# Patient Record
Sex: Female | Born: 1980 | Race: Black or African American | Hispanic: No | Marital: Married | Smoking: Never smoker
Health system: Southern US, Community
[De-identification: ages and names within clinical notes are randomized; demographics above are authoritative.]

## PROBLEM LIST (undated history)

## (undated) DIAGNOSIS — K219 Gastro-esophageal reflux disease without esophagitis: Secondary | ICD-10-CM

## (undated) DIAGNOSIS — A048 Other specified bacterial intestinal infections: Secondary | ICD-10-CM

## (undated) DIAGNOSIS — Z789 Other specified health status: Secondary | ICD-10-CM

## (undated) DIAGNOSIS — Z9109 Other allergy status, other than to drugs and biological substances: Secondary | ICD-10-CM

## (undated) HISTORY — DX: Gastro-esophageal reflux disease without esophagitis: K21.9

## (undated) HISTORY — DX: Other specified bacterial intestinal infections: A04.8

---
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
## 2002-01-12 ENCOUNTER — Other Ambulatory Visit: Admission: RE | Admit: 2002-01-12 | Discharge: 2002-01-12 | Payer: Self-pay | Admitting: Family Medicine

## 2003-04-25 ENCOUNTER — Emergency Department (HOSPITAL_COMMUNITY): Admission: EM | Admit: 2003-04-25 | Discharge: 2003-04-25 | Payer: Self-pay | Admitting: Emergency Medicine

## 2009-01-29 ENCOUNTER — Encounter: Admission: RE | Admit: 2009-01-29 | Discharge: 2009-01-29 | Payer: Self-pay | Admitting: Family Medicine

## 2010-01-31 ENCOUNTER — Inpatient Hospital Stay (HOSPITAL_COMMUNITY): Admission: AD | Admit: 2010-01-31 | Payer: Self-pay | Source: Home / Self Care | Admitting: Obstetrics and Gynecology

## 2010-03-06 ENCOUNTER — Inpatient Hospital Stay (HOSPITAL_COMMUNITY): Payer: 59

## 2010-03-06 ENCOUNTER — Inpatient Hospital Stay (HOSPITAL_COMMUNITY)
Admission: AD | Admit: 2010-03-06 | Discharge: 2010-03-24 | DRG: 778 | Disposition: A | Discharge status: Home / Self Care | Payer: 59 | Source: Ambulatory Visit | Attending: Obstetrics & Gynecology | Admitting: Obstetrics & Gynecology

## 2010-03-06 ENCOUNTER — Encounter (HOSPITAL_COMMUNITY): Payer: 59

## 2010-03-06 ENCOUNTER — Encounter (HOSPITAL_COMMUNITY): Payer: Self-pay | Admitting: Radiology

## 2010-03-06 DIAGNOSIS — O409XX0 Polyhydramnios, unspecified trimester, not applicable or unspecified: Secondary | ICD-10-CM

## 2010-03-06 DIAGNOSIS — O358XX0 Maternal care for other (suspected) fetal abnormality and damage, not applicable or unspecified: Secondary | ICD-10-CM | POA: Diagnosis present

## 2010-03-06 DIAGNOSIS — Q893 Situs inversus: Secondary | ICD-10-CM

## 2010-03-06 DIAGNOSIS — O321XX0 Maternal care for breech presentation, not applicable or unspecified: Secondary | ICD-10-CM | POA: Diagnosis present

## 2010-03-06 DIAGNOSIS — R6252 Short stature (child): Secondary | ICD-10-CM

## 2010-03-06 DIAGNOSIS — O4700 False labor before 37 completed weeks of gestation, unspecified trimester: Principal | ICD-10-CM

## 2010-03-07 ENCOUNTER — Inpatient Hospital Stay (HOSPITAL_COMMUNITY): Payer: 59

## 2010-03-08 ENCOUNTER — Inpatient Hospital Stay (HOSPITAL_COMMUNITY): Payer: 59

## 2010-03-09 ENCOUNTER — Inpatient Hospital Stay (HOSPITAL_COMMUNITY): Payer: 59

## 2010-03-10 ENCOUNTER — Inpatient Hospital Stay (HOSPITAL_COMMUNITY)
Admit: 2010-03-10 | Discharge: 2010-03-10 | Disposition: A | Discharge status: Home / Self Care | Payer: 59 | Attending: Obstetrics & Gynecology | Admitting: Obstetrics & Gynecology

## 2010-03-10 ENCOUNTER — Other Ambulatory Visit (HOSPITAL_COMMUNITY): Payer: 59

## 2010-03-10 ENCOUNTER — Inpatient Hospital Stay (HOSPITAL_COMMUNITY): Payer: 59

## 2010-03-11 ENCOUNTER — Inpatient Hospital Stay (HOSPITAL_COMMUNITY): Payer: 59

## 2010-03-12 ENCOUNTER — Inpatient Hospital Stay (HOSPITAL_COMMUNITY): Payer: 59

## 2010-03-13 ENCOUNTER — Inpatient Hospital Stay (HOSPITAL_COMMUNITY): Payer: 59

## 2010-03-14 ENCOUNTER — Inpatient Hospital Stay (HOSPITAL_COMMUNITY): Payer: 59

## 2010-03-15 ENCOUNTER — Inpatient Hospital Stay (HOSPITAL_COMMUNITY): Payer: 59

## 2010-03-16 ENCOUNTER — Other Ambulatory Visit (HOSPITAL_COMMUNITY): Payer: 59

## 2010-03-17 ENCOUNTER — Inpatient Hospital Stay (HOSPITAL_COMMUNITY): Payer: 59

## 2010-03-18 ENCOUNTER — Inpatient Hospital Stay (HOSPITAL_COMMUNITY): Payer: 59

## 2010-03-18 ENCOUNTER — Other Ambulatory Visit (HOSPITAL_COMMUNITY): Payer: 59

## 2010-03-19 ENCOUNTER — Other Ambulatory Visit (HOSPITAL_COMMUNITY): Payer: 59

## 2010-03-20 ENCOUNTER — Inpatient Hospital Stay (HOSPITAL_COMMUNITY): Payer: 59

## 2010-03-22 ENCOUNTER — Inpatient Hospital Stay (HOSPITAL_COMMUNITY): Payer: 59

## 2010-03-24 ENCOUNTER — Inpatient Hospital Stay (HOSPITAL_COMMUNITY): Payer: 59

## 2010-03-24 ENCOUNTER — Inpatient Hospital Stay (HOSPITAL_COMMUNITY): DRG: 778 | Payer: 59 | Attending: Obstetrics & Gynecology

## 2010-03-29 ENCOUNTER — Inpatient Hospital Stay (HOSPITAL_COMMUNITY)
Admission: AD | Admit: 2010-03-29 | Discharge: 2010-03-31 | DRG: 765 | Disposition: A | Discharge status: Home / Self Care | Payer: 59 | Source: Ambulatory Visit | Attending: Obstetrics and Gynecology | Admitting: Obstetrics and Gynecology

## 2010-03-29 ENCOUNTER — Other Ambulatory Visit: Payer: Self-pay | Admitting: Obstetrics & Gynecology

## 2010-03-29 ENCOUNTER — Inpatient Hospital Stay (HOSPITAL_COMMUNITY): Payer: 59

## 2010-03-29 DIAGNOSIS — O9903 Anemia complicating the puerperium: Secondary | ICD-10-CM | POA: Diagnosis not present

## 2010-03-29 DIAGNOSIS — D62 Acute posthemorrhagic anemia: Secondary | ICD-10-CM | POA: Diagnosis not present

## 2010-03-29 DIAGNOSIS — O4290 Premature rupture of membranes, unspecified as to length of time between rupture and onset of labor, unspecified weeks of gestation: Secondary | ICD-10-CM | POA: Diagnosis present

## 2010-03-29 DIAGNOSIS — O328XX0 Maternal care for other malpresentation of fetus, not applicable or unspecified: Principal | ICD-10-CM | POA: Diagnosis present

## 2010-03-29 LAB — CBC
MCH: 27.6 pg (ref 26.0–34.0)
MCHC: 34 g/dL (ref 30.0–36.0)
Platelets: 229 10*3/uL (ref 150–400)
RDW: 12.2 % (ref 11.5–15.5)

## 2010-03-29 LAB — RPR: RPR Ser Ql: NONREACTIVE

## 2010-03-30 LAB — CBC
MCHC: 32.8 g/dL (ref 30.0–36.0)
Platelets: 168 10*3/uL (ref 150–400)
RDW: 12.2 % (ref 11.5–15.5)

## 2010-04-02 NOTE — Op Note (Signed)
NAMEMEYER, DOCKERY               ACCOUNT NO.:  [id number]  MEDICAL RECORD NO.:  [id number]           PATIENT TYPE:  I  LOCATION:  9316                          [facility]ILITY:  [facility]  PHYSICIAN:  Darryl Nestle, MD     DATE OF BIRTH:  20-Apr-1980  DATE OF PROCEDURE:  03/29/2010 DATE OF DISCHARGE:                              OPERATIVE REPORT   PREOPERATIVE DIAGNOSES:  A 31 weeks and 3 days premature rupture of membranes, preterm labor, footling breech.  POSTOPERATIVE DIAGNOSES:  A 31 weeks and 3 days premature rupture of membranes, preterm labor, footling breech.  PROCEDURE:  Primary classical cesarean section.  SURGEON:  Darryl Nestle, MD  ASSISTANT:  Marlinda Mike, CNM  ANESTHESIA:  Spinal.  FINDINGS:  Baby in footling breech, complete breech extraction delivery, female infant, born at 2:44 a.m., weight 2 pounds and 13 ounces, Apgars 9 and 9, three-vessel cord, normal placenta.  SPECIMEN:  Placenta (cord blood sent for gas, but clotted).  Disposition of placenta to pathology.  ESTIMATED BLOOD LOSS:  600 mL.  IV FLUIDS:  1700 LR.  URINE OUTPUT:  600 mL, clear.  COMPLICATIONS:  None.  CONDITION:  Stable.  PATIENT DISPOSITION:  PACU.  PROCEDURE:  The patient is a 30 year old G1, P0 at 31 weeks and 3 days who had premature rupture of membranes, preterm labor.  She was 3 cm dilated with baby's foot presenting at cervical external os without any  cord prolapse. Of note, the patient had polyhydramnios and preterm cervical dilatation.  She was admitted for 2 weeks.  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  The patient had received betamethasone and was on Procardia 30 mg XL every 12 hours.  She had rupture of membranes at about 12 midnight and presented to MAU.  She was noted to have strong uterine contractions with cervical dilatation to 3 cm and 1 foot of the fetus at external os of cervix in the vagina.   Hence, the decision was made to proceed with cesarean delivery.  Neonatology Service was notified.  The patient received magnesium sulfate while getting prepared for surgery for fetal neuroprophylaxis.  She received 1 g Ancef preop.  She was brought to the operating room with IV running after informed written consent was obtained, after discussing risks and complications of surgery including infection, bleeding and damage to internal organs and delayed complications.  The patient received spinal anesthesia without complications, which was noted to be adequate.  Foley catheter was placed.  Parts were prepped and draped in normal standard fashion.  A Pfannenstiel incision was made with scalpel, carried down to the underlying fascia, which was incised in the midline extended laterally curving up.  Fascia was separated from the muscles by blunt and sharp dissection.  Muscles were separated in the midline.  Peritoneum was grasped with pickups and entered sharply with scissors and extended superiorly and inferiorly.  Bowel loops were packed with moist sponge, which was tagged.  The lower segment was narrow and hence decision was made to proceed with classical cesarean section.  Bladder flap was created  by incising the UV fold of peritoneum and a vertical incision was made on the uterus, which was carried down to the underlying amniotic membrane.  The incision was extended superiorly and inferiorly to have adequate exposure.  Baby's back was anterior.  The buttocks were reached gently.  The baby was lifted out of the incision with the buttocks, the legs followed with gentle flexion at the knee.  Both the arms were delivered by gentle rotation over the chest and the head was delivered by a gentle flexion of the head with the finger in the mouth as well as fundal pressure.  Cord was clamped and cut.  Baby was given to the awaiting neonatology team.  Cord gas collection was attempted.  Small  amount collected, which was later on noted to be clotted.   Placenta was removed manually. Interior of the uterus was cleaned with clean lap x2.  Placenta was sent to pathology, appeared to be complete in a three vessels.  The uterine hysterotomy edges were grasped with Allis and hysterotomy was closed in three layers; first layer was continuous interlocking, second one was imbricating and third was a baseball stitch of Vicryl.  Hemostasis was excellent.  Peritoneal irrigation was performed.  Bilateral ovaries and tubes appeared normal.  Parietal peritoneum was closed using 2-0 Vicryl. Fascia was closed using 0 Vicryl starting at two angles meeting in midline.  Subcutaneous tissue was inspected.  Hemostasis was achieved. Skin was approximated using 3-0 Vicryl in subcuticular fashion.  Steri- Strips applied, sterile dressing given and the patient was brought to the recovery room. All counts were correct x2.   Baby was brought to the NICU in stable condition.     Darryl Nestle, MD     VM/MEDQ  D:  03/29/2010  T:  03/29/2010  Job:  [id number]  Electronically Signed by Susa Day Mouhamed Glassco  on 04/02/2010 04:58:38 PM

## 2010-04-02 NOTE — Discharge Summary (Signed)
Heidi Collins, Heidi Collins               ACCOUNT NO.:  [id number]  MEDICAL RECORD NO.:  [id number]           PATIENT TYPE:  I  LOCATION:  9152                          [facility]  PHYSICIAN:  Darryl Nestle, MD     DATE OF BIRTH:  12/09/1980  DATE OF ADMISSION:  03/06/2010 DATE OF DISCHARGE:  03/24/2010                              DISCHARGE SUMMARY   ADMISSION DIAGNOSES: 1. G1, P0, 28 weeks and 1 day, preterm labor. 2. Polyhydramnios. 3. Fetal stomach anomaly.  DISCHARGE DIAGNOSES: 1. G1, P0, 28 weeks and 1 day, preterm labor. 2. Polyhydramnios. 3. Fetal stomach anomaly. 4. 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  ADMISSION AND DISCHARGE PHYSICIAN:  Darryl Nestle, MD  CONSULTANTS:  MFM, Dr. Sherrie George, and Dr. Tona Sensing; and pediatric cardiologist, Dr. Elizebeth Brooking for fetal echo, and neonatologist, Dr. Eric Form.  DISCHARGE CONDITION:  Stable.  DISCHARGE DISPOSITION:  Home with family.  Follow up in office, with MFM and with Pediatric surgeon at [facility] hosp.  HOSPITAL COURSE:  The patient is 30 year old G1, P0 who had uncomplicated pregnancy until 28 weeks.  Patient's prenatal course began at 8 weeks at [facility].  She had normal prenatal labs.  She had first semester screen with ultra screen and nuchal testing which were normal and she had a 16-week AFP which was normal.  The patient had a normal anatomy ultrasound at [facility] at 18 weeks which was normal.  At 28 weeks, the patient was noted to have significant weight gain, lower extremity edema, and her size of the fundus of the uterus was much larger for gestational age.  She had an obstetric ultrasound on March 06, 2010, that noted significant polyhydramnios approximately 33-36 cm. Appropriate for gestational age fetal growth but a double bubble on the stomach suggesting duodenal atresia.  The patient was noted to be 1-cm dilated with bulging lower segment.   She was immediately admitted to the hospital.  She was on bedrest with continuous monitoring of her contractions and vital signs were monitored per protocol.  She had intermittent fetal heart tracing because of fetal activity, but it was reassuring.  She received two doses of betamethasone on March 06, 2010, and March 07, 2010, for fetal lung maturity.  She was begun on Indocin 25 mg q.6 hours for 72 hours.  She had Neonatology consult with Dr. Eric Form and MFM consult.  The patient had initial ultrasound with Radiology, but was reviewed by Dr. Rica Koyanagi.  She had a followup study with Dr. Sherrie George.  Findings are in document and then 2 weeks after, on March 24, 2010, she had a followup ultrasound for growth as well as evaluation of anomaly with Dr. Tona Sensing.  During her hospital stay, she also had a consult for fetal echogram with Dr. Elizebeth Brooking in-house.  The fetal echogram was normal.  Her growth ultrasound with, Dr. Tona Sensing, showed appropriate growth in the 2 weeks.  The ultrasound noted same stomach anomaly with heterotaxy or partial situs inversus with liver and stomach in the opposite side and also a possible duodenal atresia  versus some obstruction causing dilated stomach as well as dilated bowel loops. The fetal heart tracing was intermittently reactive and so she received biophysical profile initially daily and then we did alternate days if biophysical profile was 8/8.  On March 23, 2010, intermittent variable decelerations were noted on fetal heart tracing and therefore the patient was placed on continuous monitoring without any further decelerations for 12 hours prior to discharge.  The biophysical profile was 8/8 on the date of her growth ultrasound on March 24, 2010.  Her cervical exam was repeated on March 24, 2010, which was unchanged at 1-cm with distended lower segment.  Please note, the baby was in breech position on all the ultrasound that were done at the  hospital.   The patient was on nifedipine 30 mg XL every 12 hours after Indocin was stopped which was continued as a discharge medication. She had not changed her cervical dilatation any further and fetal status was reassuring with adequate growth.  The patient was feeling adequate fetal kick counts, this is a very compliant patient who has an adult family member at home to assist and she prefers to be discharged home after extensive counseling.  Decision was made for discharge.  The patient will follow up in office two times a week for antenatal testing with NST and biophysical profile.  She will have every 2 weeks ultrasound with MFM for growth and evaluation of the anomaly.  The patient will have outpatient consult with pediatric surgeon at [facility] for preparation for surgery at birth.  Preterm labor warning signs as well as fetal activity evaluation throughout the day, warning signs of bleeding and loss of fluid, etc, were reviewed.  The patient verbalized understanding and aware that she may need readmission if any findings change.  The patient and her husband voiced understanding and will follow up as recommended in office and for all consults.  The patient was given the option to be transferred to [facility] for MFM care versus delivery at [facility] and then transfer of baby to [facility] for operative care.  Neonatology team, including Dr. Eric Form are comfortable having the delivery of the baby at [facility] and then transporting the baby to [facility] and patient prefers to be delivered in [facility] since she is from [place] and her husband works in [place], and they prefer to be delivered here in [facility]. We will accommodate her requisition when it is possible, but she is aware that if anything changes for her as well as the baby, the plan may change for delivery at [facility].  The patient was discharged home in stable  condition.     Darryl Nestle, MD     VM/MEDQ  D:  03/26/2010  T:  03/27/2010  Job:  [id number]  Electronically Signed by Susa Day Charlei Ramsaran  on 04/02/2010 04:54:43 PM

## 2010-04-07 ENCOUNTER — Ambulatory Visit (HOSPITAL_COMMUNITY): Payer: 59

## 2010-05-07 ENCOUNTER — Other Ambulatory Visit: Payer: Self-pay | Admitting: Obstetrics & Gynecology

## 2010-06-02 NOTE — Discharge Summary (Signed)
  Heidi Collins               ACCOUNT NO.:  [id number]  MEDICAL RECORD NO.:  [id number]           PATIENT TYPE:  I  LOCATION:  9316                          [facility]  [facility]  PHYSICIAN:  Darryl Nestle, MD     DATE OF BIRTH:  05-27-1980  DATE OF ADMISSION:  03/29/2010 DATE OF DISCHARGE:  03/31/2010                              DISCHARGE SUMMARY   ADMISSION DIAGNOSES:  A 31 weeks' gestation, spontaneous rupture of membranes, preterm labor, breech presentation.  DISCHARGE DIAGNOSES:  Postoperative day #3, status post cesarean section.  PATIENT HISTORY:  The patient is a gravida 1, para 0 at 66 weeks' gestation with an [facility] of May 28, 2010.  Prenatal care has been at [facility] with Dr. Juliene Pina as primary since 10 weeks' gestation. Pregnancy is complicated by history of preterm labor, preterm cervical change.  PRENATAL LABS:  The patient is A positive.  Antibody screen negative. Rubella positive.  HIV negative.  RPR negative.  Hepatitis B negative. GBS positive.  MEDICATIONS:  Prenatal vitamin 1 tablet p.o. daily.  ALLERGIES:  No known drug allergies.  MEDICAL AND SURGICAL HISTORY:  Noncontributory.  PRESENTATION:  The patient presents with onset of labor and spontaneous rupture of membranes, confirm breech presentation.  Decision to proceed with immediate cesarean section.  Preoperative CBC; white blood cell count 17.1, hemoglobin 14.4, hematocrit 42.9, and platelet count of 229.  PROCEDURE:  The patient undergoes cesarean section primary by Dr. Juliene Pina, delivery of a female 2 pounds 3 ounces.  Newborn is transferred to NICU.  POSTOPERATIVE COURSE:  The patient's postoperative course is uneventful. Postoperative CBC, white blood cell count 14.9, hemoglobin 9.8, hematocrit 29.9, and a platelet count of 168.  The patient remains afebrile during hospitalization.  Vital signs are stable.  Physical exam was within normal limits.  Wound edges are well approximated  with subcuticular closure.  No erythema, no ecchymosis, and no drainage.  DISCHARGE INSTRUCTIONS:  The patient discharged home in stable condition.  ACTIVITY:  Postoperative restrictions x2 weeks.  DIET:  Regular.  MEDICATIONS AT THE TIME OF DISCHARGE: 1. Prenatal vitamin 1 tablet p.o. daily. 2. Motrin 800 mg p.o. q.8 hours p.r.n. discomfort. 3. Percocet 1-2 tablets p.o. q.4 hours p.r.n. for pain.     Marlinda Mike, C.N.M.   ______________________________ Darryl Nestle, MD    TB/MEDQ  D:  04/22/2010  T:  04/23/2010  Job:  [id number]  Electronically Signed by Marlinda Mike C.N.M. on 04/23/2010 04:24:00 PM Electronically Signed by Susa Day Lakely Elmendorf  on 06/02/2010 03:17:39 PM

## 2010-11-17 ENCOUNTER — Encounter (HOSPITAL_COMMUNITY): Payer: Self-pay | Admitting: *Deleted

## 2011-02-25 ENCOUNTER — Emergency Department (HOSPITAL_COMMUNITY)
Admission: EM | Admit: 2011-02-25 | Discharge: 2011-02-25 | Disposition: A | Discharge status: Home / Self Care | Payer: 59 | Attending: Emergency Medicine | Admitting: Emergency Medicine

## 2011-02-25 ENCOUNTER — Encounter (HOSPITAL_COMMUNITY): Payer: Self-pay | Admitting: Emergency Medicine

## 2011-02-25 DIAGNOSIS — H9209 Otalgia, unspecified ear: Secondary | ICD-10-CM | POA: Insufficient documentation

## 2011-02-25 DIAGNOSIS — J329 Chronic sinusitis, unspecified: Secondary | ICD-10-CM

## 2011-02-25 DIAGNOSIS — R220 Localized swelling, mass and lump, head: Secondary | ICD-10-CM | POA: Insufficient documentation

## 2011-02-25 DIAGNOSIS — J3489 Other specified disorders of nose and nasal sinuses: Secondary | ICD-10-CM | POA: Insufficient documentation

## 2011-02-25 DIAGNOSIS — H11419 Vascular abnormalities of conjunctiva, unspecified eye: Secondary | ICD-10-CM | POA: Insufficient documentation

## 2011-02-25 DIAGNOSIS — H6690 Otitis media, unspecified, unspecified ear: Secondary | ICD-10-CM

## 2011-02-25 MED ORDER — AMOXICILLIN 500 MG PO CAPS
ORAL_CAPSULE | ORAL | Status: AC
  Filled 2011-02-25: qty 1

## 2011-02-25 MED ORDER — AMOXICILLIN 500 MG PO CAPS
500.0000 mg | ORAL_CAPSULE | Freq: Once | ORAL | Status: AC
  Administered 2011-02-25: 23:00:00 via ORAL

## 2011-02-25 MED ORDER — AMOXICILLIN 500 MG PO CAPS: 500.0000 mg | ORAL_CAPSULE | Freq: Three times a day (TID) | ORAL | Status: AC

## 2011-02-25 NOTE — ED Provider Notes (Signed)
History     CSN: [id number]  Arrival date & time 02/25/11  2143   First MD Initiated Contact with Patient 02/25/11 2153      Chief Complaint  Patient presents with  . Otalgia    (Consider location/radiation/quality/duration/timing/severity/associated sxs/prior treatment) Patient is a 31 y.o. female presenting with URI. The history is provided by the patient.  URI The primary symptoms include ear pain. Primary symptoms do not include fever, sore throat, cough or rash. The current episode started 2 days ago. This is a new problem. The problem has been gradually worsening.  The onset of the illness is associated with exposure to sick contacts. Symptoms associated with the illness include sinus pressure, congestion and rhinorrhea. The following treatments were addressed: Acetaminophen was ineffective. NSAIDs were ineffective.    History reviewed. No pertinent past medical history.  Past Surgical History  Procedure Date  . Cesarean section     No family history on file.  History  Substance Use Topics  . Smoking status: Never Smoker   . Smokeless tobacco: Not on file  . Alcohol Use: No    OB History    Grav Para Term Preterm Abortions TAB SAB Ect Mult Living   2 1  1             Review of Systems  Constitutional: Negative for fever.  HENT: Positive for ear pain, congestion, rhinorrhea and sinus pressure. Negative for sore throat.   Respiratory: Negative for cough.   Skin: Negative for rash.  All other systems reviewed and are negative.    Allergies  Review of patient's allergies indicates no known allergies.  Home Medications   Current Outpatient Rx  Name Route Sig Dispense Refill  . ROBITUSSIN PEAK COLD DM PO Oral Take 10 mLs by mouth every 4 (four) hours as needed. For cough and cold    . IBUPROFEN 200 MG PO TABS Oral Take 100 mg by mouth every 6 (six) hours as needed. For fever    . NORETHINDRONE 0.35 MG PO TABS Oral Take 1 tablet by mouth daily.      BP  128/86  Pulse 65  Temp(Src) 97.7 F (36.5 C) (Oral)  Resp 18  SpO2 98%  Breastfeeding? Yes  Physical Exam  Nursing note and vitals reviewed. Constitutional: She is oriented to person, place, and time. She appears well-developed and well-nourished. No distress.  HENT:  Head: Normocephalic and atraumatic.  Right Ear: Tympanic membrane and ear canal normal.  Left Ear: Tympanic membrane is injected, erythematous and bulging. Tympanic membrane mobility is abnormal.  Nose: Mucosal edema and rhinorrhea present.  Mouth/Throat: Oropharynx is clear and moist.  Eyes: EOM are normal. Pupils are equal, round, and reactive to light.  Neck: Normal range of motion. Neck supple.  Musculoskeletal: Normal range of motion. She exhibits no tenderness.       No edema  Neurological: She is alert and oriented to person, place, and time. No cranial nerve deficit.  Skin: Skin is warm and dry. No rash noted.  Psychiatric: She has a normal mood and affect. Her behavior is normal.    ED Course  Procedures (including critical care time)  Labs Reviewed - No data to display No results found.   1. Otitis media   2. Sinusitis       MDM   Pt with sinusitis and ear pain starting today with left otitis media.  Will treat with nasal sprays and abx.        [facility],  MD 02/25/11 2234

## 2011-02-25 NOTE — ED Notes (Signed)
PT. REPORTS BILATERAL EAR ACHE WITH NASAL CONGESTION / RUNNY NOSE FOR 2 DAYS .

## 2011-05-04 ENCOUNTER — Ambulatory Visit: Payer: Self-pay | Admitting: Family Medicine

## 2011-05-04 ENCOUNTER — Other Ambulatory Visit: Payer: Self-pay | Admitting: Internal Medicine

## 2011-05-05 LAB — MEASLES/MUMPS/RUBELLA IMMUNITY
Mumps IgG: 1.69 {ISR} — ABNORMAL HIGH
Rubella: 110.5 IU/mL — ABNORMAL HIGH

## 2013-10-18 LAB — OB RESULTS CONSOLE HEPATITIS B SURFACE ANTIGEN: Hepatitis B Surface Ag: NEGATIVE

## 2013-10-18 LAB — OB RESULTS CONSOLE ANTIBODY SCREEN: Antibody Screen: NEGATIVE

## 2013-10-18 LAB — OB RESULTS CONSOLE ABO/RH: RH Type: POSITIVE

## 2013-10-18 LAB — OB RESULTS CONSOLE HIV ANTIBODY (ROUTINE TESTING): HIV: NONREACTIVE

## 2013-10-18 LAB — OB RESULTS CONSOLE RPR: RPR: NONREACTIVE

## 2013-10-18 LAB — OB RESULTS CONSOLE RUBELLA ANTIBODY, IGM: RUBELLA: IMMUNE

## 2013-12-03 ENCOUNTER — Encounter (HOSPITAL_COMMUNITY): Payer: Self-pay | Admitting: Emergency Medicine

## 2013-12-18 ENCOUNTER — Other Ambulatory Visit (HOSPITAL_COMMUNITY): Payer: Self-pay | Admitting: Obstetrics & Gynecology

## 2013-12-18 DIAGNOSIS — O289 Unspecified abnormal findings on antenatal screening of mother: Secondary | ICD-10-CM

## 2013-12-21 ENCOUNTER — Encounter (HOSPITAL_COMMUNITY): Payer: Self-pay

## 2013-12-21 ENCOUNTER — Ambulatory Visit (HOSPITAL_COMMUNITY)
Admission: RE | Admit: 2013-12-21 | Discharge: 2013-12-21 | Disposition: A | Discharge status: Home / Self Care | Payer: 59 | Source: Ambulatory Visit | Attending: Obstetrics & Gynecology | Admitting: Obstetrics & Gynecology

## 2013-12-21 ENCOUNTER — Ambulatory Visit (HOSPITAL_COMMUNITY): Admission: RE | Admit: 2013-12-21 | Payer: 59 | Source: Ambulatory Visit

## 2013-12-21 DIAGNOSIS — O3421 Maternal care for scar from previous cesarean delivery: Secondary | ICD-10-CM | POA: Insufficient documentation

## 2013-12-21 DIAGNOSIS — O350XX0 Maternal care for (suspected) central nervous system malformation in fetus, not applicable or unspecified: Secondary | ICD-10-CM | POA: Diagnosis present

## 2013-12-21 DIAGNOSIS — Z3A18 18 weeks gestation of pregnancy: Secondary | ICD-10-CM | POA: Diagnosis not present

## 2013-12-21 DIAGNOSIS — O09212 Supervision of pregnancy with history of pre-term labor, second trimester: Secondary | ICD-10-CM | POA: Insufficient documentation

## 2013-12-21 DIAGNOSIS — O289 Unspecified abnormal findings on antenatal screening of mother: Secondary | ICD-10-CM | POA: Insufficient documentation

## 2013-12-21 HISTORY — DX: Other specified health status: Z78.9

## 2013-12-25 ENCOUNTER — Other Ambulatory Visit (HOSPITAL_COMMUNITY): Payer: Self-pay

## 2014-01-08 ENCOUNTER — Other Ambulatory Visit (HOSPITAL_COMMUNITY): Payer: Self-pay | Admitting: Obstetrics & Gynecology

## 2014-01-08 ENCOUNTER — Encounter (HOSPITAL_COMMUNITY): Payer: Self-pay

## 2014-01-08 ENCOUNTER — Ambulatory Visit (HOSPITAL_COMMUNITY)
Admission: RE | Admit: 2014-01-08 | Discharge: 2014-01-08 | Disposition: A | Discharge status: Home / Self Care | Payer: 59 | Source: Ambulatory Visit | Attending: Obstetrics & Gynecology | Admitting: Obstetrics & Gynecology

## 2014-01-08 VITALS — BP 137/65 | HR 72 | Wt 148.0 lb

## 2014-01-08 DIAGNOSIS — O350XX0 Maternal care for (suspected) central nervous system malformation in fetus, not applicable or unspecified: Secondary | ICD-10-CM | POA: Diagnosis present

## 2014-01-08 DIAGNOSIS — O3509X0 Maternal care for (suspected) other central nervous system malformation or damage in fetus, not applicable or unspecified: Secondary | ICD-10-CM | POA: Insufficient documentation

## 2014-01-08 DIAGNOSIS — O4700 False labor before 37 completed weeks of gestation, unspecified trimester: Secondary | ICD-10-CM

## 2014-01-08 DIAGNOSIS — Q893 Situs inversus: Secondary | ICD-10-CM

## 2014-01-08 DIAGNOSIS — O09292 Supervision of pregnancy with other poor reproductive or obstetric history, second trimester: Secondary | ICD-10-CM | POA: Insufficient documentation

## 2014-01-08 DIAGNOSIS — Z3A20 20 weeks gestation of pregnancy: Secondary | ICD-10-CM | POA: Diagnosis not present

## 2014-01-10 ENCOUNTER — Other Ambulatory Visit (HOSPITAL_COMMUNITY): Payer: Self-pay | Admitting: Obstetrics & Gynecology

## 2014-01-10 DIAGNOSIS — O3509X0 Maternal care for (suspected) other central nervous system malformation or damage in fetus, not applicable or unspecified: Secondary | ICD-10-CM

## 2014-01-10 DIAGNOSIS — O350XX0 Maternal care for (suspected) central nervous system malformation in fetus, not applicable or unspecified: Secondary | ICD-10-CM

## 2014-02-05 ENCOUNTER — Encounter (HOSPITAL_COMMUNITY): Payer: Self-pay

## 2014-02-05 ENCOUNTER — Ambulatory Visit (HOSPITAL_COMMUNITY)
Admission: RE | Admit: 2014-02-05 | Discharge: 2014-02-05 | Disposition: A | Discharge status: Home / Self Care | Payer: 59 | Source: Ambulatory Visit | Attending: Obstetrics & Gynecology | Admitting: Obstetrics & Gynecology

## 2014-02-05 DIAGNOSIS — O3509X0 Maternal care for (suspected) other central nervous system malformation or damage in fetus, not applicable or unspecified: Secondary | ICD-10-CM

## 2014-02-05 DIAGNOSIS — Z3A24 24 weeks gestation of pregnancy: Secondary | ICD-10-CM | POA: Diagnosis not present

## 2014-02-05 DIAGNOSIS — O350XX0 Maternal care for (suspected) central nervous system malformation in fetus, not applicable or unspecified: Secondary | ICD-10-CM

## 2014-02-05 DIAGNOSIS — O358XX0 Maternal care for other (suspected) fetal abnormality and damage, not applicable or unspecified: Secondary | ICD-10-CM | POA: Diagnosis present

## 2014-02-05 DIAGNOSIS — O3421 Maternal care for scar from previous cesarean delivery: Secondary | ICD-10-CM | POA: Insufficient documentation

## 2014-02-05 DIAGNOSIS — O09212 Supervision of pregnancy with history of pre-term labor, second trimester: Secondary | ICD-10-CM | POA: Insufficient documentation

## 2014-02-06 ENCOUNTER — Other Ambulatory Visit (HOSPITAL_COMMUNITY): Payer: Self-pay | Admitting: Obstetrics & Gynecology

## 2014-02-06 DIAGNOSIS — O352XX1 Maternal care for (suspected) hereditary disease in fetus, fetus 1: Secondary | ICD-10-CM

## 2014-02-06 DIAGNOSIS — O34219 Maternal care for unspecified type scar from previous cesarean delivery: Secondary | ICD-10-CM

## 2014-02-06 DIAGNOSIS — Z8751 Personal history of pre-term labor: Secondary | ICD-10-CM

## 2014-02-06 DIAGNOSIS — O359XX0 Maternal care for (suspected) fetal abnormality and damage, unspecified, not applicable or unspecified: Secondary | ICD-10-CM

## 2014-02-06 DIAGNOSIS — Z3A28 28 weeks gestation of pregnancy: Secondary | ICD-10-CM

## 2014-03-05 ENCOUNTER — Other Ambulatory Visit (HOSPITAL_COMMUNITY): Payer: Self-pay | Admitting: Obstetrics & Gynecology

## 2014-03-05 ENCOUNTER — Encounter (HOSPITAL_COMMUNITY): Payer: Self-pay

## 2014-03-05 ENCOUNTER — Ambulatory Visit (HOSPITAL_COMMUNITY)
Admission: RE | Admit: 2014-03-05 | Discharge: 2014-03-05 | Disposition: A | Discharge status: Home / Self Care | Payer: 59 | Source: Ambulatory Visit | Attending: Obstetrics & Gynecology | Admitting: Obstetrics & Gynecology

## 2014-03-05 DIAGNOSIS — O352XX1 Maternal care for (suspected) hereditary disease in fetus, fetus 1: Secondary | ICD-10-CM

## 2014-03-05 DIAGNOSIS — O09293 Supervision of pregnancy with other poor reproductive or obstetric history, third trimester: Secondary | ICD-10-CM | POA: Insufficient documentation

## 2014-03-05 DIAGNOSIS — O359XX0 Maternal care for (suspected) fetal abnormality and damage, unspecified, not applicable or unspecified: Secondary | ICD-10-CM

## 2014-03-05 DIAGNOSIS — Z8751 Personal history of pre-term labor: Secondary | ICD-10-CM

## 2014-03-05 DIAGNOSIS — O358XX0 Maternal care for other (suspected) fetal abnormality and damage, not applicable or unspecified: Secondary | ICD-10-CM | POA: Diagnosis present

## 2014-03-05 DIAGNOSIS — O3421 Maternal care for scar from previous cesarean delivery: Secondary | ICD-10-CM | POA: Diagnosis not present

## 2014-03-05 DIAGNOSIS — Z3A28 28 weeks gestation of pregnancy: Secondary | ICD-10-CM | POA: Insufficient documentation

## 2014-03-05 DIAGNOSIS — O34219 Maternal care for unspecified type scar from previous cesarean delivery: Secondary | ICD-10-CM

## 2014-03-18 ENCOUNTER — Other Ambulatory Visit: Payer: Self-pay | Admitting: Obstetrics & Gynecology

## 2014-04-09 ENCOUNTER — Encounter (HOSPITAL_COMMUNITY): Payer: Self-pay

## 2014-04-09 ENCOUNTER — Ambulatory Visit (HOSPITAL_COMMUNITY)
Admission: RE | Admit: 2014-04-09 | Discharge: 2014-04-09 | Disposition: A | Discharge status: Home / Self Care | Payer: 59 | Source: Ambulatory Visit | Attending: Internal Medicine | Admitting: Internal Medicine

## 2014-04-09 DIAGNOSIS — O09293 Supervision of pregnancy with other poor reproductive or obstetric history, third trimester: Secondary | ICD-10-CM | POA: Insufficient documentation

## 2014-04-09 DIAGNOSIS — O352XX0 Maternal care for (suspected) hereditary disease in fetus, not applicable or unspecified: Secondary | ICD-10-CM | POA: Insufficient documentation

## 2014-04-09 DIAGNOSIS — O359XX0 Maternal care for (suspected) fetal abnormality and damage, unspecified, not applicable or unspecified: Secondary | ICD-10-CM | POA: Insufficient documentation

## 2014-04-09 DIAGNOSIS — Z3A28 28 weeks gestation of pregnancy: Secondary | ICD-10-CM

## 2014-04-09 DIAGNOSIS — O34219 Maternal care for unspecified type scar from previous cesarean delivery: Secondary | ICD-10-CM

## 2014-04-09 DIAGNOSIS — O3421 Maternal care for scar from previous cesarean delivery: Secondary | ICD-10-CM | POA: Diagnosis not present

## 2014-04-09 DIAGNOSIS — Z3A33 33 weeks gestation of pregnancy: Secondary | ICD-10-CM | POA: Insufficient documentation

## 2014-04-09 DIAGNOSIS — O352XX1 Maternal care for (suspected) hereditary disease in fetus, fetus 1: Secondary | ICD-10-CM

## 2014-04-09 DIAGNOSIS — O358XX0 Maternal care for other (suspected) fetal abnormality and damage, not applicable or unspecified: Secondary | ICD-10-CM | POA: Diagnosis not present

## 2014-04-09 DIAGNOSIS — Z8751 Personal history of pre-term labor: Secondary | ICD-10-CM

## 2014-05-01 ENCOUNTER — Encounter (HOSPITAL_COMMUNITY)
Admission: RE | Admit: 2014-05-01 | Discharge: 2014-05-01 | Disposition: A | Discharge status: Home / Self Care | Payer: 59 | Source: Ambulatory Visit | Attending: Obstetrics & Gynecology | Admitting: Obstetrics & Gynecology

## 2014-05-01 ENCOUNTER — Encounter (HOSPITAL_COMMUNITY): Payer: Self-pay

## 2014-05-01 HISTORY — DX: Other allergy status, other than to drugs and biological substances: Z91.09

## 2014-05-01 LAB — CBC
HEMATOCRIT: 37.1 % (ref 36.0–46.0)
Hemoglobin: 12.3 g/dL (ref 12.0–15.0)
MCH: 27.7 pg (ref 26.0–34.0)
MCHC: 33.2 g/dL (ref 30.0–36.0)
MCV: 83.6 fL (ref 78.0–100.0)
Platelets: 121 10*3/uL — ABNORMAL LOW (ref 150–400)
RBC: 4.44 MIL/uL (ref 3.87–5.11)
RDW: 13.8 % (ref 11.5–15.5)
WBC: 12.9 10*3/uL — ABNORMAL HIGH (ref 4.0–10.5)

## 2014-05-01 LAB — TYPE AND SCREEN
ABO/RH(D): A POS
Antibody Screen: NEGATIVE

## 2014-05-01 LAB — ABO/RH: ABO/RH(D): A POS

## 2014-05-01 MED ORDER — CEFAZOLIN SODIUM-DEXTROSE 2-3 GM-% IV SOLR
2.0000 g | INTRAVENOUS | Status: AC
  Administered 2014-05-02: 2 g via INTRAVENOUS

## 2014-05-01 NOTE — Patient Instructions (Signed)
Your procedure is scheduled on:05/02/14 Enter through the Main Entrance at :1130 am Pick up desk phone and dial [phone] and inform us of your arrival.  Please call [phone] if you have any problems the morning of surgery.  Remember: Do not eat food after midnight:tonight Clear liquids are ok until:9am Thursday   You may brush your teeth the morning of surgery.    DO NOT wear jewelry, eye make-up, lipstick,body lotion, or dark fingernail polish.  (Polished toes are ok) You may wear deodorant.  If you are to be admitted after surgery, leave suitcase in car until your room has been assigned. Patients discharged on the day of surgery will not be allowed to drive home. Wear loose fitting, comfortable clothes for your ride home.

## 2014-05-01 NOTE — H&P (Addendum)
Heidi Collins is a 34 y.o. female presenting for repeat C/section at 37.1 wks due to prior Classical c/section.  Prenatal care at [facility]. On 17-progesterone injections from 16-36 wks for prior preterm. 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   G1- PTD at 31.1/2 wks, girl, had duodenal atresia diagnosed at 28 wks, [facility]   History OB History    Gravida Para Term Preterm AB TAB SAB Ectopic Multiple Living   2 1  1      1      Past Medical History  Diagnosis Date  . Medical history non-contributory   . Environmental allergies    Past Surgical History  Procedure Laterality Date  . Cesarean section     Family History: family history is not on file. Social History:  reports that she has never smoked. She does not have any smokeless tobacco history on file. She reports that she does not drink alcohol or use illicit drugs.   Prenatal Transfer Tool  Maternal Diabetes: No Genetic Screening: Normal Ultrascreen neg and AFP1 normal Maternal Ultrasounds/Referrals: Abnormal:  Findings:   Other: Cerebellar vermis hypoplasia/ Dandy walker variant Fetal Ultrasounds or other Referrals:  None Maternal Substance Abuse:  none Significant Maternal Medications:  Meds include: Progesterone Significant Maternal Lab Results:  Lab values include: Group B Strep negative Other Comments:  None  ROS neg     Last menstrual period 08/15/2013, currently breastfeeding. Exam Physical Exam  BP 139/74 mmHg  Pulse 91  Temp(Src) 98.8 F (37.1 C) (Oral)  Resp 18  SpO2 100%  LMP 08/15/2013 A&O x 3, no acute distress. Pleasant HEENT neg, no thyromegaly Lungs CTA bilat CV RRR, S1S2 normal Abdo soft, non tender, non acute Extr no edema/ tenderness Pelvic deferred FHT  Normal  Toco none   Prenatal labs: ABO, Rh: --/--/A POS (03/30  1400) Antibody: NEG (03/30 1400) Rubella: Immune (09/17 0000) RPR: Nonreactive (09/17 0000)  HBsAg: Negative (09/17 0000)  HIV: Non-reactive (09/17 0000)  GBS:   negative   Assessment/Plan: 34 yo G2P0101 at 37 wks for Rpt C/s due to prior classical c/section.  Risks/complications of surgery reviewed incl infection, bleeding, damage to internal organs including bladder, bowels, ureters, blood vessels, other risks from anesthesia, VTE and delayed complications of any surgery, complications in future surgery reviewed. Also discussed neonatal complications incl difficult delivery, laceration, vacuum assistance, TTN etc. Pt understands and agrees, all concerns addressed.     Heidi Collins R 05/01/2014, 6:19 PM   05/02/14 MD note Pt seen and evaluated, no change in H&P. Agrees.  Heidi Stotz, MD

## 2014-05-01 NOTE — Pre-Procedure Instructions (Signed)
Dr. Sherron AlesK. Jackson notified about patient's low platelet count and he wants test repeated on DOS.

## 2014-05-02 ENCOUNTER — Inpatient Hospital Stay (HOSPITAL_COMMUNITY)
Admission: RE | Admit: 2014-05-02 | Discharge: 2014-05-05 | DRG: 765 | Disposition: A | Discharge status: Home / Self Care | Payer: 59 | Source: Ambulatory Visit | Attending: Obstetrics & Gynecology | Admitting: Obstetrics & Gynecology

## 2014-05-02 ENCOUNTER — Encounter (HOSPITAL_COMMUNITY)
Admission: RE | Disposition: A | Discharge status: Home / Self Care | Payer: Self-pay | Source: Ambulatory Visit | Attending: Obstetrics & Gynecology

## 2014-05-02 ENCOUNTER — Inpatient Hospital Stay (HOSPITAL_COMMUNITY): Payer: 59 | Admitting: Anesthesiology

## 2014-05-02 ENCOUNTER — Encounter (HOSPITAL_COMMUNITY): Payer: Self-pay | Admitting: *Deleted

## 2014-05-02 DIAGNOSIS — D696 Thrombocytopenia, unspecified: Secondary | ICD-10-CM | POA: Diagnosis present

## 2014-05-02 DIAGNOSIS — Z3A37 37 weeks gestation of pregnancy: Secondary | ICD-10-CM | POA: Diagnosis present

## 2014-05-02 DIAGNOSIS — O350XX0 Maternal care for (suspected) central nervous system malformation in fetus, not applicable or unspecified: Secondary | ICD-10-CM | POA: Diagnosis present

## 2014-05-02 DIAGNOSIS — O3421 Maternal care for scar from previous cesarean delivery: Secondary | ICD-10-CM | POA: Diagnosis present

## 2014-05-02 DIAGNOSIS — O9912 Other diseases of the blood and blood-forming organs and certain disorders involving the immune mechanism complicating childbirth: Secondary | ICD-10-CM | POA: Diagnosis present

## 2014-05-02 DIAGNOSIS — O99119 Other diseases of the blood and blood-forming organs and certain disorders involving the immune mechanism complicating pregnancy, unspecified trimester: Secondary | ICD-10-CM

## 2014-05-02 DIAGNOSIS — O321XX0 Maternal care for breech presentation, not applicable or unspecified: Secondary | ICD-10-CM | POA: Diagnosis present

## 2014-05-02 DIAGNOSIS — Z98891 History of uterine scar from previous surgery: Secondary | ICD-10-CM

## 2014-05-02 LAB — PLATELET COUNT: Platelets: 137 10*3/uL — ABNORMAL LOW (ref 150–400)

## 2014-05-02 LAB — RPR: RPR Ser Ql: NONREACTIVE

## 2014-05-02 SURGERY — Surgical Case
Anesthesia: Spinal

## 2014-05-02 MED ORDER — IBUPROFEN 600 MG PO TABS
600.0000 mg | ORAL_TABLET | Freq: Four times a day (QID) | ORAL | Status: DC
  Administered 2014-05-03 – 2014-05-05 (×9): 600 mg via ORAL
  Filled 2014-05-02 (×12): qty 1

## 2014-05-02 MED ORDER — PRENATAL MULTIVITAMIN CH
1.0000 | ORAL_TABLET | Freq: Every day | ORAL | Status: DC
  Administered 2014-05-03 – 2014-05-05 (×3): 1 via ORAL
  Filled 2014-05-02 (×3): qty 1

## 2014-05-02 MED ORDER — OXYTOCIN 10 UNIT/ML IJ SOLN
40.0000 [IU] | INTRAVENOUS | Status: DC | PRN
  Administered 2014-05-02: 40 [IU] via INTRAVENOUS

## 2014-05-02 MED ORDER — NALOXONE HCL 1 MG/ML IJ SOLN
1.0000 ug/kg/h | INTRAVENOUS | Status: DC | PRN
  Filled 2014-05-02: qty 2

## 2014-05-02 MED ORDER — SCOPOLAMINE 1 MG/3DAYS TD PT72
MEDICATED_PATCH | TRANSDERMAL | Status: AC
  Administered 2014-05-02: 1.5 mg via TRANSDERMAL
  Filled 2014-05-02: qty 1

## 2014-05-02 MED ORDER — FENTANYL CITRATE 0.05 MG/ML IJ SOLN: 25.0000 ug | INTRAMUSCULAR | Status: DC | PRN

## 2014-05-02 MED ORDER — MORPHINE SULFATE 0.5 MG/ML IJ SOLN
INTRAMUSCULAR | Status: AC
  Filled 2014-05-02: qty 10

## 2014-05-02 MED ORDER — OXYTOCIN 10 UNIT/ML IJ SOLN
INTRAMUSCULAR | Status: AC
  Filled 2014-05-02: qty 4

## 2014-05-02 MED ORDER — NALBUPHINE HCL 10 MG/ML IJ SOLN: 5.0000 mg | INTRAMUSCULAR | Status: DC | PRN

## 2014-05-02 MED ORDER — FENTANYL CITRATE 0.05 MG/ML IJ SOLN
INTRAMUSCULAR | Status: AC
  Filled 2014-05-02: qty 2

## 2014-05-02 MED ORDER — OXYCODONE-ACETAMINOPHEN 5-325 MG PO TABS
1.0000 | ORAL_TABLET | ORAL | Status: DC | PRN
  Administered 2014-05-03 – 2014-05-05 (×3): 1 via ORAL
  Filled 2014-05-02 (×3): qty 1

## 2014-05-02 MED ORDER — IBUPROFEN 600 MG PO TABS
600.0000 mg | ORAL_TABLET | Freq: Four times a day (QID) | ORAL | Status: DC | PRN
  Administered 2014-05-02 – 2014-05-04 (×2): 600 mg via ORAL

## 2014-05-02 MED ORDER — MEPERIDINE HCL 25 MG/ML IJ SOLN: 6.2500 mg | INTRAMUSCULAR | Status: DC | PRN

## 2014-05-02 MED ORDER — DEXAMETHASONE SODIUM PHOSPHATE 4 MG/ML IJ SOLN
INTRAMUSCULAR | Status: DC | PRN
  Administered 2014-05-02: 4 mg via INTRAVENOUS

## 2014-05-02 MED ORDER — WITCH HAZEL-GLYCERIN EX PADS: 1.0000 "application " | MEDICATED_PAD | CUTANEOUS | Status: DC | PRN

## 2014-05-02 MED ORDER — DIPHENHYDRAMINE HCL 25 MG PO CAPS
25.0000 mg | ORAL_CAPSULE | ORAL | Status: DC | PRN
  Filled 2014-05-02: qty 1

## 2014-05-02 MED ORDER — FENTANYL CITRATE 0.05 MG/ML IJ SOLN
INTRAMUSCULAR | Status: DC | PRN
  Administered 2014-05-02: 25 ug via INTRATHECAL

## 2014-05-02 MED ORDER — NALOXONE HCL 0.4 MG/ML IJ SOLN: 0.4000 mg | INTRAMUSCULAR | Status: DC | PRN

## 2014-05-02 MED ORDER — SODIUM CHLORIDE 0.9 % IJ SOLN: 3.0000 mL | INTRAMUSCULAR | Status: DC | PRN

## 2014-05-02 MED ORDER — ONDANSETRON HCL 4 MG/2ML IJ SOLN: 4.0000 mg | Freq: Three times a day (TID) | INTRAMUSCULAR | Status: DC | PRN

## 2014-05-02 MED ORDER — MORPHINE SULFATE (PF) 0.5 MG/ML IJ SOLN
INTRAMUSCULAR | Status: DC | PRN
  Administered 2014-05-02: .15 mg via INTRATHECAL

## 2014-05-02 MED ORDER — SENNOSIDES-DOCUSATE SODIUM 8.6-50 MG PO TABS
2.0000 | ORAL_TABLET | ORAL | Status: DC
  Administered 2014-05-02 – 2014-05-05 (×3): 2 via ORAL
  Filled 2014-05-02 (×3): qty 2

## 2014-05-02 MED ORDER — LACTATED RINGERS IV SOLN
INTRAVENOUS | Status: DC
  Administered 2014-05-02: 14:00:00 via INTRAVENOUS

## 2014-05-02 MED ORDER — TETANUS-DIPHTH-ACELL PERTUSSIS 5-2.5-18.5 LF-MCG/0.5 IM SUSP
0.5000 mL | Freq: Once | INTRAMUSCULAR | Status: DC
Start: 2014-05-03 — End: 2014-05-03

## 2014-05-02 MED ORDER — SIMETHICONE 80 MG PO CHEW
80.0000 mg | CHEWABLE_TABLET | ORAL | Status: DC
  Administered 2014-05-02 – 2014-05-04 (×2): 80 mg via ORAL
  Filled 2014-05-02 (×2): qty 1

## 2014-05-02 MED ORDER — SCOPOLAMINE 1 MG/3DAYS TD PT72: 1.0000 | MEDICATED_PATCH | Freq: Once | TRANSDERMAL | Status: DC

## 2014-05-02 MED ORDER — DIBUCAINE 1 % RE OINT: 1.0000 "application " | TOPICAL_OINTMENT | RECTAL | Status: DC | PRN

## 2014-05-02 MED ORDER — SIMETHICONE 80 MG PO CHEW
80.0000 mg | CHEWABLE_TABLET | ORAL | Status: DC | PRN
  Administered 2014-05-02 – 2014-05-05 (×2): 80 mg via ORAL
  Filled 2014-05-02: qty 1

## 2014-05-02 MED ORDER — NALBUPHINE HCL 10 MG/ML IJ SOLN: 5.0000 mg | Freq: Once | INTRAMUSCULAR | Status: AC | PRN

## 2014-05-02 MED ORDER — OXYTOCIN 40 UNITS IN LACTATED RINGERS INFUSION - SIMPLE MED: 62.5000 mL/h | INTRAVENOUS | Status: AC

## 2014-05-02 MED ORDER — MENTHOL 3 MG MT LOZG: 1.0000 | LOZENGE | OROMUCOSAL | Status: DC | PRN

## 2014-05-02 MED ORDER — ACETAMINOPHEN 325 MG PO TABS: 650.0000 mg | ORAL_TABLET | ORAL | Status: DC | PRN

## 2014-05-02 MED ORDER — DIPHENHYDRAMINE HCL 50 MG/ML IJ SOLN: 12.5000 mg | INTRAMUSCULAR | Status: DC | PRN

## 2014-05-02 MED ORDER — CEFAZOLIN SODIUM-DEXTROSE 2-3 GM-% IV SOLR
INTRAVENOUS | Status: AC
  Filled 2014-05-02: qty 50

## 2014-05-02 MED ORDER — METOCLOPRAMIDE HCL 5 MG/ML IJ SOLN
INTRAMUSCULAR | Status: DC | PRN
  Administered 2014-05-02: 10 mg via INTRAVENOUS

## 2014-05-02 MED ORDER — SODIUM CHLORIDE 0.9 % IR SOLN
Status: DC | PRN
  Administered 2014-05-02: 1000 mL

## 2014-05-02 MED ORDER — PHENYLEPHRINE 8 MG IN D5W 100 ML (0.08MG/ML) PREMIX OPTIME
INJECTION | INTRAVENOUS | Status: DC | PRN
  Administered 2014-05-02: 60 ug/min via INTRAVENOUS

## 2014-05-02 MED ORDER — PHENYLEPHRINE 8 MG IN D5W 100 ML (0.08MG/ML) PREMIX OPTIME
INJECTION | INTRAVENOUS | Status: AC
  Filled 2014-05-02: qty 100

## 2014-05-02 MED ORDER — OXYCODONE-ACETAMINOPHEN 5-325 MG PO TABS
2.0000 | ORAL_TABLET | ORAL | Status: DC | PRN
  Administered 2014-05-03 – 2014-05-05 (×6): 2 via ORAL
  Filled 2014-05-02 (×6): qty 2

## 2014-05-02 MED ORDER — BUPIVACAINE IN DEXTROSE 0.75-8.25 % IT SOLN
INTRATHECAL | Status: DC | PRN
  Administered 2014-05-02: 1.5 mL via INTRATHECAL

## 2014-05-02 MED ORDER — SCOPOLAMINE 1 MG/3DAYS TD PT72
1.0000 | MEDICATED_PATCH | Freq: Once | TRANSDERMAL | Status: DC
  Administered 2014-05-02: 1.5 mg via TRANSDERMAL

## 2014-05-02 MED ORDER — ZOLPIDEM TARTRATE 5 MG PO TABS: 5.0000 mg | ORAL_TABLET | Freq: Every evening | ORAL | Status: DC | PRN

## 2014-05-02 MED ORDER — ONDANSETRON HCL 4 MG/2ML IJ SOLN
INTRAMUSCULAR | Status: AC
  Filled 2014-05-02: qty 2

## 2014-05-02 MED ORDER — LACTATED RINGERS IV SOLN
INTRAVENOUS | Status: DC
  Administered 2014-05-03: 1 mL via INTRAVENOUS

## 2014-05-02 MED ORDER — ACETAMINOPHEN 160 MG/5ML PO SOLN: 325.0000 mg | ORAL | Status: DC | PRN

## 2014-05-02 MED ORDER — ACETAMINOPHEN 500 MG PO TABS: 1000.0000 mg | ORAL_TABLET | Freq: Four times a day (QID) | ORAL | Status: AC

## 2014-05-02 MED ORDER — DIPHENHYDRAMINE HCL 25 MG PO CAPS: 25.0000 mg | ORAL_CAPSULE | Freq: Four times a day (QID) | ORAL | Status: DC | PRN

## 2014-05-02 MED ORDER — ACETAMINOPHEN 325 MG PO TABS: 325.0000 mg | ORAL_TABLET | ORAL | Status: DC | PRN

## 2014-05-02 MED ORDER — LACTATED RINGERS IV SOLN
Freq: Once | INTRAVENOUS | Status: AC
  Administered 2014-05-02 (×2): via INTRAVENOUS

## 2014-05-02 MED ORDER — PROMETHAZINE HCL 25 MG/ML IJ SOLN: 6.2500 mg | INTRAMUSCULAR | Status: DC | PRN

## 2014-05-02 MED ORDER — MIDAZOLAM HCL 2 MG/2ML IJ SOLN: 0.5000 mg | Freq: Once | INTRAMUSCULAR | Status: DC | PRN

## 2014-05-02 MED ORDER — SIMETHICONE 80 MG PO CHEW
80.0000 mg | CHEWABLE_TABLET | Freq: Three times a day (TID) | ORAL | Status: DC
  Administered 2014-05-03 – 2014-05-05 (×7): 80 mg via ORAL
  Filled 2014-05-02 (×8): qty 1

## 2014-05-02 MED ORDER — LANOLIN HYDROUS EX OINT: 1.0000 "application " | TOPICAL_OINTMENT | CUTANEOUS | Status: DC | PRN

## 2014-05-02 SURGICAL SUPPLY — 42 items
BARRIER ADHS 3X4 INTERCEED (GAUZE/BANDAGES/DRESSINGS) ×3 IMPLANT
BENZOIN TINCTURE PRP APPL 2/3 (GAUZE/BANDAGES/DRESSINGS) ×3 IMPLANT
CLAMP CORD UMBIL (MISCELLANEOUS) IMPLANT
CLOSURE WOUND 1/2 X4 (GAUZE/BANDAGES/DRESSINGS) ×1
CLOTH BEACON ORANGE TIMEOUT ST (SAFETY) ×3 IMPLANT
CONTAINER PREFILL 10% NBF 15ML (MISCELLANEOUS) IMPLANT
DRAPE SHEET LG 3/4 BI-LAMINATE (DRAPES) IMPLANT
DRSG OPSITE POSTOP 4X10 (GAUZE/BANDAGES/DRESSINGS) ×3 IMPLANT
DURAPREP 26ML APPLICATOR (WOUND CARE) ×3 IMPLANT
ELECT REM PT RETURN 9FT ADLT (ELECTROSURGICAL) ×3
ELECTRODE REM PT RTRN 9FT ADLT (ELECTROSURGICAL) ×1 IMPLANT
EXTRACTOR VACUUM KIWI (MISCELLANEOUS) IMPLANT
EXTRACTOR VACUUM M CUP 4 TUBE (SUCTIONS) IMPLANT
EXTRACTOR VACUUM M CUP 4' TUBE (SUCTIONS)
GLOVE BIO SURGEON STRL SZ7 (GLOVE) ×3 IMPLANT
GLOVE BIOGEL PI IND STRL 7.0 (GLOVE) ×1 IMPLANT
GLOVE BIOGEL PI INDICATOR 7.0 (GLOVE) ×2
GLOVE ECLIPSE 6.5 STRL STRAW (GLOVE) ×3 IMPLANT
GLOVE INDICATOR 7.0 STRL GRN (GLOVE) ×12 IMPLANT
GLOVE SURG SS PI 7.0 STRL IVOR (GLOVE) ×3 IMPLANT
GOWN STRL REUS W/TWL LRG LVL3 (GOWN DISPOSABLE) ×9 IMPLANT
KIT ABG SYR 3ML LUER SLIP (SYRINGE) IMPLANT
NEEDLE HYPO 25X5/8 SAFETYGLIDE (NEEDLE) IMPLANT
NS IRRIG 1000ML POUR BTL (IV SOLUTION) ×3 IMPLANT
PACK C SECTION WH (CUSTOM PROCEDURE TRAY) ×3 IMPLANT
PAD ABD 7.5X8 STRL (GAUZE/BANDAGES/DRESSINGS) ×3 IMPLANT
PAD OB MATERNITY 4.3X12.25 (PERSONAL CARE ITEMS) ×3 IMPLANT
RTRCTR C-SECT PINK 25CM LRG (MISCELLANEOUS) IMPLANT
STAPLER VISISTAT 35W (STAPLE) IMPLANT
STRIP CLOSURE SKIN 1/2X4 (GAUZE/BANDAGES/DRESSINGS) ×2 IMPLANT
SUT MON AB-0 CT1 36 (SUTURE) ×9 IMPLANT
SUT PLAIN 0 NONE (SUTURE) IMPLANT
SUT PLAIN 2 0 (SUTURE)
SUT PLAIN ABS 2-0 CT1 27XMFL (SUTURE) IMPLANT
SUT VIC AB 0 CT1 27 (SUTURE) ×4
SUT VIC AB 0 CT1 27XBRD ANBCTR (SUTURE) ×2 IMPLANT
SUT VIC AB 2-0 CT1 27 (SUTURE) ×4
SUT VIC AB 2-0 CT1 TAPERPNT 27 (SUTURE) ×2 IMPLANT
SUT VIC AB 4-0 KS 27 (SUTURE) ×3 IMPLANT
SUT VICRYL 0 TIES 12 18 (SUTURE) IMPLANT
TOWEL OR 17X24 6PK STRL BLUE (TOWEL DISPOSABLE) ×3 IMPLANT
TRAY FOLEY CATH 14FR (SET/KITS/TRAYS/PACK) IMPLANT

## 2014-05-02 NOTE — Transfer of Care (Signed)
Immediate Anesthesia Transfer of Care Note  Patient: Heidi Collins  Procedure(s) Performed: Procedure(s) with comments: Repeat CESAREAN SECTION (N/A) - EDD: 05/22/14  Patient Location: PACU  Anesthesia Type:Spinal  Level of Consciousness: awake, alert  and oriented  Airway & Oxygen Therapy: Patient Spontanous Breathing  Post-op Assessment: Report given to RN and Post -op Vital signs reviewed and stable  Post vital signs: Reviewed and stable  Last Vitals:  Filed Vitals:   05/02/14 1127  BP: 139/74  Pulse: 91  Temp: 37.1 C  Resp: 18    Complications: No apparent anesthesia complications

## 2014-05-02 NOTE — Anesthesia Postprocedure Evaluation (Signed)
  Anesthesia Post Note  Patient: Heidi Collins  Procedure(s) Performed: Procedure(s) (LRB): Repeat CESAREAN SECTION (N/A)  Anesthesia type: Spinal  Patient location: PACU  Post pain: Pain level controlled  Post assessment: Post-op Vital signs reviewed  Last Vitals:  Filed Vitals:   05/02/14 1430  BP: 115/54  Pulse: 83  Temp: 36.3 C  Resp: 23    Post vital signs: Reviewed  Level of consciousness: awake  Complications: No apparent anesthesia complications

## 2014-05-02 NOTE — Lactation Note (Signed)
This note was copied from the chart of Heidi Collins. Lactation Consultation Note  Patient Name: Heidi Collins WUJWJ'XToday's Date: 05/02/2014 Reason for consult: Initial assessment;Infant < 6lbs;Other (Comment) (infant is early term at 37 weeks 1 day) This is mom's second baby and her first child, now 89 yo was breastfed for 9 months and was born at 7431 weeks.  DEBP and LPI handout have been given to mom by RN staff.  LC reviewed LPI guidelines, although baby is early term at >37 weeks but <6 lbs.  LC encouraged frequent STS and cue feedings (at least q3h and DEBP q3h for additional stimulation of milk supply. RN, Marcelino DusterMichelle had shown mom hand expression at 1800 and baby has breastfed twice and received 7 ml's of formula once (baby now 8 hours old and has voided twice).  Mom encouraged to feed baby 8-12 times/24 hours and with feeding cues. LC encouraged review of Baby and Me pp 9, 14 and 20-25 for STS and BF information. LC provided [facility] brochure and reviewed [facility] and list of community and web site resources.    Maternal Data Formula Feeding for Exclusion: No Has patient been taught Hand Expression?: Yes (per RN at 1800) Does the patient have breastfeeding experience prior to this delivery?: Yes  Feeding Feeding Type: Formula Nipple Type: Slow - flow Length of feed: 10 min  LATCH Score/Interventions           LATCH score=7 at earlier feeding assessment per RN; no swallows noted and baby and mom needed latch assistance           Lactation Tools Discussed/Used Pump Review: Milk Storage Initiated by:: RN,Michelle initiated DEBP Date initiated:: 05/02/14 STS, cue feedings, hand expression LPI handout and guidelines for care and feeding  Consult Status Consult Status: Follow-up Date: 05/03/14 Follow-up type: In-patient    Warrick ParisianBryant, Preet Mangano [facility] 05/02/2014, 10:38 PM

## 2014-05-02 NOTE — Anesthesia Preprocedure Evaluation (Signed)
Anesthesia Evaluation  Patient identified by MRN, date of birth, ID band Patient awake    Reviewed: Allergy & Precautions, H&P , NPO status , Patient's Chart, lab work & pertinent test results  Airway Mallampati: II       Dental   Pulmonary  breath sounds clear to auscultation        Cardiovascular Exercise Tolerance: Good Rhythm:regular Rate:Normal     Neuro/Psych    GI/Hepatic   Endo/Other    Renal/GU      Musculoskeletal   Abdominal   Peds  Hematology   Anesthesia Other Findings   Reproductive/Obstetrics (+) Pregnancy                             Anesthesia Physical Anesthesia Plan  ASA: II  Anesthesia Plan: Spinal   Post-op Pain Management:    Induction:   Airway Management Planned:   Additional Equipment:   Intra-op Plan:   Post-operative Plan:   Informed Consent: I have reviewed the patients History and Physical, chart, labs and discussed the procedure including the risks, benefits and alternatives for the proposed anesthesia with the patient or authorized representative who has indicated his/her understanding and acceptance.     Plan Discussed with: Anesthesiologist, CRNA and Surgeon  Anesthesia Plan Comments:         Anesthesia Quick Evaluation  

## 2014-05-02 NOTE — Anesthesia Procedure Notes (Signed)
Spinal Patient location during procedure: OR Start time: 05/02/2014 1:07 PM Staffing Anesthesiologist: Brayton CavesJACKSON, Merl Guardino Performed by: anesthesiologist  Preanesthetic Checklist Completed: patient identified, site marked, surgical consent, pre-op evaluation, timeout performed, IV checked, risks and benefits discussed and monitors and equipment checked Spinal Block Patient position: sitting Prep: DuraPrep Patient monitoring: heart rate, cardiac monitor, continuous pulse ox and blood pressure Approach: midline Location: L3-4 Injection technique: single-shot Needle Needle type: Sprotte  Needle gauge: 24 G Needle length: 9 cm Assessment Sensory level: T4 Additional Notes Patient identified.  Risk benefits discussed including failed block, incomplete pain control, headache, nerve damage, paralysis, blood pressure changes, nausea, vomiting, reactions to medication both toxic or allergic, and postpartum back pain.  Patient expressed understanding and wished to proceed.  All questions were answered.  Sterile technique used throughout procedure.  CSF was clear.  No parasthesia or other complications.  Please see nursing notes for vital signs.

## 2014-05-02 NOTE — Op Note (Signed)
Cesarean Section Procedure Note Heidi ColtCamie D Lightcap 05/02/2014  Procedure: Repeat Low Transverse Cesarean Section  Indications: Prior Classical C-section.    Pre-operative Diagnosis: Previous Classical Cesarean Section. 37 wks. Breech.   Post-operative Diagnosis: Same   Surgeon: Shea EvansVaishali Matther Labell, MD    Assistants: Raelyn Moraolitta Dawson, CNM  Anesthesia: spinal   Procedure Details:  The patient was seen in the Holding Room. The risks, benefits, complications, treatment options, and expected outcomes were discussed with the patient. The patient concurred with the proposed plan, giving informed consent. identified as Heidi Collins and the procedure verified as C-Section Delivery. A Time Out was held and the above information confirmed.  After induction of anesthesia, the patient was draped and prepped in the usual sterile manner. A Pfannenstiel incision was made through prior scar and carried down through the subcutaneous tissue to the fascia. Fascial incision was made and extended transversely. The fascia was separated from the underlying rectus tissue superiorly and inferiorly where dense scar tissue was noted.  The peritoneum was identified and entered. Peritoneal incision was extended longitudinally. Right half of the opening was adherent to anterior uterine wall and bladder was noted adherent to lower segment. After separating the perineum from uterine adhesions, the  utero-vesical peritoneal reflection was incised transversely and the bladder flap was bluntly freed from the lower uterine segment. A low transverse uterine incision was made.Clear fluid noted.  Delivered from complete breech presentation was a healthy female infant  with Apgar scores of 9 at one minute and 9 at five minutes. Cord ph was not sent the umbilical cord was clamped and cut cord blood was obtained for evaluation. The placenta was removed Intact and appeared normal. The uterine outline, tubes and ovaries appeared normal.  The uterine  incision was closed with running locked sutures of 0Monocryl and a second imbricating layer was closed. Hemostasis was observed. Peritoneal adhesions from upper uterine area were released and Interceed was placed. Pyramidalis sutured in midline and Interceed placed on top. The fascia was then reapproximated with running sutures of 0Vicryl. The subcuticular closure was performed using 4-0Vicryl.   Steristrips and sterile dressing placed.   Instrument, sponge, and needle counts were correct prior the abdominal closure and were correct at the conclusion of the case.    Findings:   Breech extraction from complete breech position of baby girl via Kerr/ low transverse hysterotomy.  Apgars 9 and 9. Weight 5 lb 11 oz. Clear fluid. Normal tubes and ovaries.    Estimated Blood Loss: 500 cc  Total IV Fluids: 1800 cc  Urine Output: 150 cc  Specimens: Placenta, cord blood  Complications: no complications  Disposition: PACU - hemodynamically stable.   Maternal Condition: stable   Baby condition / location:  Couplet care / Skin to Skin  Attending Attestation: I performed the procedure.   Signed: Surgeon(s): Shea EvansVaishali Quindarius Cabello, MD

## 2014-05-03 ENCOUNTER — Encounter (HOSPITAL_COMMUNITY): Payer: Self-pay | Admitting: Obstetrics & Gynecology

## 2014-05-03 DIAGNOSIS — D696 Thrombocytopenia, unspecified: Secondary | ICD-10-CM | POA: Diagnosis present

## 2014-05-03 DIAGNOSIS — O99119 Other diseases of the blood and blood-forming organs and certain disorders involving the immune mechanism complicating pregnancy, unspecified trimester: Secondary | ICD-10-CM

## 2014-05-03 LAB — CBC
HCT: 35.3 % — ABNORMAL LOW (ref 36.0–46.0)
Hemoglobin: 11.5 g/dL — ABNORMAL LOW (ref 12.0–15.0)
MCH: 26.7 pg (ref 26.0–34.0)
MCHC: 32.6 g/dL (ref 30.0–36.0)
MCV: 82.1 fL (ref 78.0–100.0)
Platelets: 120 10*3/uL — ABNORMAL LOW (ref 150–400)
RBC: 4.3 MIL/uL (ref 3.87–5.11)
RDW: 13.5 % (ref 11.5–15.5)
WBC: 18 10*3/uL — ABNORMAL HIGH (ref 4.0–10.5)

## 2014-05-03 NOTE — Addendum Note (Signed)
Addendum  created 05/03/14 0731 by Yolonda KidaAlison L Heatherly Stenner, CRNA   Modules edited: Notes Section   Notes Section:  File: [id number]

## 2014-05-03 NOTE — Anesthesia Postprocedure Evaluation (Signed)
  Anesthesia Post-op Note  Patient: Heidi Collins  Procedure(s) Performed: Procedure(s) with comments: Repeat CESAREAN SECTION (N/A) - EDD: 05/22/14  Patient Location: Mother/Baby  Anesthesia Type:Spinal  Level of Consciousness: awake, alert , oriented and patient cooperative  Airway and Oxygen Therapy: Patient Spontanous Breathing  Post-op Pain: none  Post-op Assessment: Post-op Vital signs reviewed, Patient's Cardiovascular Status Stable, Respiratory Function Stable, Patent Airway, No headache, No backache, No residual numbness and No residual motor weakness  Post-op Vital Signs: Reviewed and stable  Last Vitals:  Filed Vitals:   05/03/14 0350  BP: 108/50  Pulse: 69  Temp: 36.8 C  Resp:     Complications: No apparent anesthesia complications

## 2014-05-03 NOTE — Progress Notes (Signed)
POD # 1  Subjective: Pt reports feeling well/ Pain controlled with Motrin and Percocet Tolerating po/ Foley d/c'd and voided x1 without problems/ No n/v/ Flatus absent Activity: ad lib Bleeding is light Newborn info:  Information for the patient's newborn:  Resa MinerSanders, Girl Orva [[id number]][[id number]]  female   Feeding: breast   Objective:  VS:  Filed Vitals:   05/02/14 1934 05/02/14 2334 05/03/14 0350 05/03/14 0845  BP: 125/55 123/60 108/50 113/58  Pulse: 61 59 69 61  Temp:  97.8 F (36.6 C) 98.3 F (36.8 C) 98.4 F (36.9 C)  TempSrc:  Oral    Resp:    18  SpO2: 99% 98% 99% 97%     I&O: Intake/Output      03/31 0701 - 04/01 0700 04/01 0701 - 04/02 0700   P.O. 1500 240   I.V. 2070 250   Total Intake 3570 490   Urine 2575 600   Blood 500    Total Output 3075 600   Net +495 -110           Recent Labs  05/01/14 1400 05/02/14 1130 05/03/14 0600  WBC 12.9*  --  18.0*  HGB 12.3  --  11.5*  HCT 37.1  --  35.3*  PLT 121* 137* 120*    Blood type: --/--/A POS, A POS (03/30 1400) Rubella: Immune (09/17 0000)    Physical Exam:  General: alert and cooperative CV: Regular rate and rhythm Resp: CTA bilaterally Abdomen: soft, nontender, normal bowel sounds Incision: healing well, no erythema, no hernia, no seroma, no swelling, well approximated, honeycomb dsg intact, completely saturated with drk blood Uterine Fundus: firm, below umbilicus, nontender Lochia: minimal Ext: edema trace BLE  and Homans sign is negative, no sign of DVT    Assessment: POD # 1/ G2P1101/ S/P C/Section d/t repeat  Thrombocytopenia of pregnancy, delivered-stable Doing well  Plan: Apply new honeycomb dsg Ambulate Continue routine post op orders Consider early discharge tomorrow per pt request   Signed: Donette LarryBHAMBRI, Denasia Venn, N, MSN, CNM 05/03/2014, 9:50 AM

## 2014-05-03 NOTE — Lactation Note (Addendum)
This note was copied from the chart of Heidi Collins. Lactation Consultation Note  Patient Name: Heidi Collins JXBJY'NToday's Date: 05/03/2014 Reason for consult: Follow-up assessment Baby 18 hours of life. Mom reports breastfeeding going well. Mom has DEBP in room; however, mom states that she has not had time to use DEBP because she has been nursing baby every 2-3 hours. Mom states that she is able to hand express colostrum, and she hears baby swallowing at breast. Baby is asleep on mom's chest STS, and mom is about to eat her breakfast. Enc mom to continue to nurse with cues, and at least every 3 hours. Enc mom to call out for assistance with latching as needed.   Baby has not had a stool in 18 hours, but mom states that baby is passing gas. Baby has voided X4.  Maternal Data    Feeding    LATCH Score/Interventions                      Lactation Tools Discussed/Used Breast pump type: Double-Electric Breast Pump   Consult Status Consult Status: Follow-up Date: 05/04/14 Follow-up type: In-patient    Geralynn OchsWILLIARD, Ruairi Stutsman 05/03/2014, 8:21 AM

## 2014-05-04 NOTE — Lactation Note (Signed)
This note was copied from the chart of Heidi Collins. Lactation Consultation Note  Patient Name: Heidi Collins ZOXWR'UToday's Date: 05/04/2014 Reason for consult: Follow-up assessment Mom reports baby is latching well. Did not see latch at this visit, Mom ready to go to sleep. Mom reports she BF on 1 breast then supplements with formula. Mom reports "just waiting on milk to get in". Mom does report some breast heaviness. She is not pumping. Baby has not passed stool and barium enema is scheduled for tomorrow. LC encouraged Mom to breastfeed from both breasts each feeding before giving supplements. Try to keep baby nursing for 15-20 minutes both breasts each feeding.  Let FOB give supplement while she pumps to encourage her milk production and have breast milk to supplement. Mom denied other questions/concerns or assist at this visit. Encouraged to call for assist as needed.   Maternal Data    Feeding Feeding Type: Breast Fed Length of feed: 25 min  LATCH Score/Interventions Latch: Grasps breast easily, tongue down, lips flanged, rhythmical sucking. Intervention(s): Adjust position;Assist with latch  Audible Swallowing: Spontaneous and intermittent Intervention(s): Skin to skin  Type of Nipple: Everted at rest and after stimulation  Comfort (Breast/Nipple): Soft / non-tender  Problem noted: Mild/Moderate discomfort  Hold (Positioning): Assistance needed to correctly position infant at breast and maintain latch. Intervention(s): Support Pillows  LATCH Score: 9  Lactation Tools Discussed/Used     Consult Status Consult Status: Follow-up Date: 05/05/14 Follow-up type: In-patient    Alfred LevinsGranger, Olla Delancey Ann 05/04/2014, 11:19 PM

## 2014-05-04 NOTE — Progress Notes (Signed)
POSTOPERATIVE DAY # 2 S/P Repeat C/S   S:         Reports feeling good, some incisional pain, but well-controlled with pain medication             Tolerating po intake / no nausea / no vomiting / + flatus / no BM             Bleeding is light             Pain controlled withMotrin and Percocet             Up ad lib / ambulatory/ voiding QS  Newborn breast feeding with formula supplementation   + nipple tenderness - using comfort gels   O:  VS: BP 102/44 mmHg  Pulse 63  Temp(Src) 98.3 F (36.8 C) (Oral)  Resp 18  SpO2 98%  LMP 08/15/2013  Breastfeeding? Unknown   LABS:               Recent Labs  05/01/14 1400 05/02/14 1130 05/03/14 0600  WBC 12.9*  --  18.0*  HGB 12.3  --  11.5*  PLT 121* 137* 120*               Bloodtype: --/--/A POS, A POS (03/30 1400)  Rubella: Immune (09/17 0000)                                             I&O: Intake/Output      04/01 0701 - 04/02 0700 04/02 0701 - 04/03 0700   P.O. 600    I.V. 250    Total Intake 850     Urine 1300    Blood     Total Output 1300     Net -450                      Physical Exam:             Alert and Oriented X3  Lungs: Clear and unlabored  Heart: regular rate and rhythm / no mumurs  Abdomen: soft, non-tender, non-distended, active bowel sounds in all 4 quandrants             Fundus: firm, non-tender, U-2             Dressing: honey comb dressing clean / dry / intact / steri strips under honey comb             Incision:  approximated with sutures / no erythema / no ecchymosis / no drainage/ no evidence of seroma  Perineum: intact  Lochia: light  Extremities: +2 dependent pedal edema, no calf pain or tenderness,   A:        POD # 2 S/P Repeat C/S   Gestational thrombocytopenia, delivered, stable            Doing well, stable   P:        Routine postoperative care   Flatus comfort measures reviewed  Ambulate in halls today  Discussed possible discharge this afternoon if baby has a stool and cleared by  peds - otherwise anticipate discharge home tomorrow  Milinda CaveMeredith Yamaris Cummings, SNM

## 2014-05-05 MED ORDER — IBUPROFEN 600 MG PO TABS: 600.0000 mg | ORAL_TABLET | Freq: Four times a day (QID) | ORAL | Status: DC | PRN

## 2014-05-05 MED ORDER — OXYCODONE-ACETAMINOPHEN 5-325 MG PO TABS: 1.0000 | ORAL_TABLET | ORAL | Status: DC | PRN

## 2014-05-05 NOTE — Discharge Summary (Signed)
POSTOPERATIVE DISCHARGE SUMMARY:  Patient ID: Heidi Collins MRN: [id number] DOB/AGE: [birth year] 34 y.o.  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 Medical history non-contributory   . Environmental allergies   . Postpartum care following cesarean delivery (3/31) 05/02/2014    Past surgical history:  Past Surgical History  Procedure Laterality Date  . Cesarean section    . Cesarean section N/A 05/02/2014    Procedure: Repeat CESAREAN SECTION;  Surgeon: Shea EvansVaishali Mody, MD;  Location: [facility];  Service: Obstetrics;  Laterality: N/A;  EDD: 05/22/14    Family History: History reviewed. No pertinent family history.  Social History:  reports that she has never smoked. She does not have any smokeless tobacco history on file. She reports that she does not drink alcohol or use illicit drugs.  Allergies: Review of patient's allergies indicates no known allergies.   Current Medications at time of admission:  Prior to Admission medications   Medication Sig Start Date End Date Taking? Authorizing Provider  Prenatal Vit-Fe Fumarate-FA (PRENATAL VITAMIN PO) Take 1 tablet by mouth  daily.    Yes Historical Provider, MD  albuterol (PROVENTIL HFA;VENTOLIN HFA) 108 (90 BASE) MCG/ACT inhaler Inhale 2 puffs into the lungs every 6 (six) hours as needed for wheezing or shortness of breath.    Historical Provider, MD   Procedures: Cesarean section delivery on 05/02/2014 with delivery of  female newborn by Dr Juliene PinaMody   See operative report for further details APGAR (1 MIN): 9   APGAR (5 MINS): 9    Postoperative / postpartum course:  Uncomplicated with discharge on POD 3  Discharge Instructions:  Discharged Condition: stable  Activity: pelvic rest and postoperative restrictions x 2   Diet: routine  Medications:    Medication List    STOP taking these medications        ROBITUSSIN PEAK COLD DM PO      TAKE these medications        albuterol 108 (90 BASE) MCG/ACT inhaler  Commonly known as:  PROVENTIL HFA;VENTOLIN HFA  Inhale 2 puffs into the lungs every 6 (six) hours as needed for wheezing or shortness of breath.     ibuprofen 600 MG tablet  Commonly known as:  ADVIL,MOTRIN  Take 1 tablet (600 mg total) by mouth every 6 (six) hours as needed for mild pain.     oxyCODONE-acetaminophen 5-325 MG per tablet  Commonly known as:  PERCOCET/ROXICET  Take 1 tablet by mouth every 4 (four) hours as needed (for pain scale 4-7).     PRENATAL VITAMIN PO  Take 1 tablet by mouth daily.        Wound Care: keep clean and dry / remove honeycomb POD 5 Postpartum Instructions:  [facility] discharge booklet - instructions reviewed  Discharge to: Home  Follow up :   [facility] in 6 weeks for routine postpartum visit with Dr Juliene Pina                Signed: Marlinda Mike CNM, MSN, [facility] 05/05/2014, 12:51 PM

## 2014-05-05 NOTE — Lactation Note (Signed)
This note was copied from the chart of Heidi Collins. Lactation Consultation Note: Mother sitting on the side of breast with infant in cradle hold. Infant finishing a feeding of 15 mins. When infant released the breast observed nipple flat and pinched on underside. Assist mother to chair for good back support. Infant latched well with good deep latch on the alternate breast. Observed strong suckling and audible swallows. Mother taught breast compression. Observed mothers milk spraying when infant released the breast and mother's nipple round without pinching.  Mother's breast are filling. Infant has had only one mucus stool and a smear. Infant has had very good wets.  Advised mother to post pump and to supplement with any amt of EBM. Mother has an electric pump for home use. Reviewed pumping and milk storage guidelines. Mother receptive to all teaching. [facility]. Mother will phone as needed.  Patient Name: Heidi Collins's Date: 05/05/2014 Reason for consult: Follow-up assessment   Maternal Data    Feeding Feeding Type: Breast Fed Length of feed: 10 min  LATCH Score/Interventions Latch: Grasps breast easily, tongue down, lips flanged, rhythmical sucking.  Audible Swallowing: Spontaneous and intermittent  Type of Nipple: Everted at rest and after stimulation  Comfort (Breast/Nipple): Filling, red/small blisters or bruises, mild/mod discomfort     Hold (Positioning): Assistance needed to correctly position infant at breast and maintain latch. Intervention(s): Breastfeeding basics reviewed;Support Pillows;Position options;Skin to skin  LATCH Score: 8  Lactation Tools Discussed/Used     Consult Status Consult Status: Complete    Michel BickersKendrick, Liviya Santini McCoy 05/05/2014, 10:55 AM

## 2014-05-05 NOTE — Progress Notes (Signed)
POSTOPERATIVE DAY # 3 S/P Repeat C/S   S:         Reports feeling good, but tired             Tolerating po intake / no nausea / no vomiting / + flatus / no BM             Bleeding is light             Pain controlled with Motrin and Percocet - will have some incisional pain (burning) on the right side only, but is relieved with medication             Up ad lib / ambulatory/ voiding QS  Newborn breast feeding with formula supplementation   O:  VS: BP 126/64 mmHg  Pulse 69  Temp(Src) 97.9 F (36.6 C) (Oral)  Resp 18  SpO2 98%  LMP 08/15/2013  Breastfeeding? Unknown   LABS:               Recent Labs  05/02/14 1130 05/03/14 0600  WBC  --  18.0*  HGB  --  11.5*  PLT 137* 120*               Bloodtype: --/--/A POS, A POS (03/30 1400)  Rubella: Immune (09/17 0000)                                Physical Exam:             Alert and Oriented X3  Lungs: Clear and unlabored  Heart: regular rate and rhythm / no mumurs  Abdomen: soft, non-tender, non-distended, active bowel sounds             Fundus: firm, non-tender, U-2             Dressing: honey comb dressing clean / dry / intact / steri strips under honey comb             Incision:  approximated with sutures/ no erythema / no ecchymosis / no drainage  Perineum: intact  Lochia: scant  Extremities: +2 pedal edema, no calf pain or tenderness  A:        POD # 3 S/P Repeat C/S            Gestational thrombocytopenia, delivered, stable  Doing well, stable  P:        Routine postoperative care              Increase hydration  Remove honey comb dressing PPD #5 (Tuesday, 4/5), leave steri strips on x 2 weeks / keep dry / may use hair dryer on a low setting to keep dry  Schedule 6 week PP visit with Dr. Juliene PinaMody  Reviewed s/s to call / [facility] discharge book given and reviewed  Discharge home today   Heidi CaveMeredith Lexander Collins, SNM

## 2015-03-09 IMAGING — US US OB DETAIL+14 WK
1 series · 12 of 28 positions shown · non-contrast
Comparison: none

[Series 1: us ob detail+14 wk · 0.23mm/px · 12 of 88 slices shown]
[im 4/88]
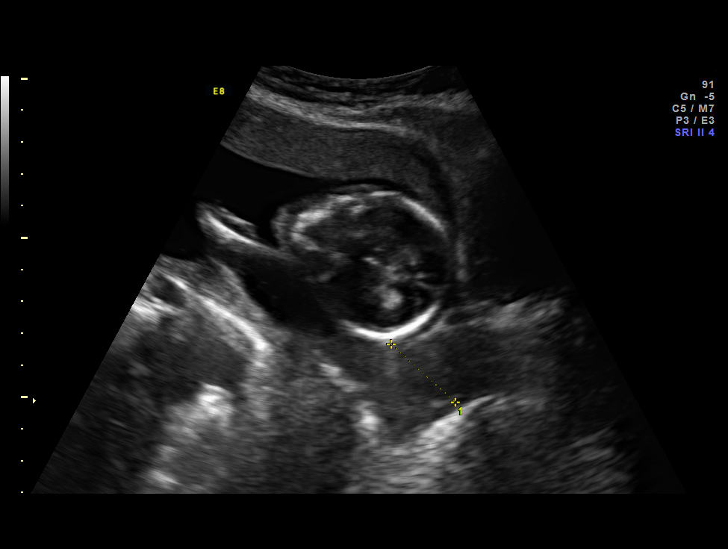
[im 10/88]
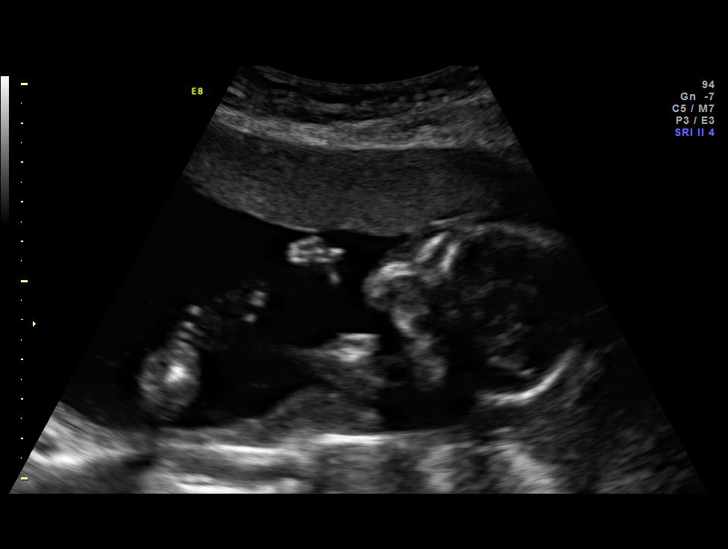
[im 17/88]
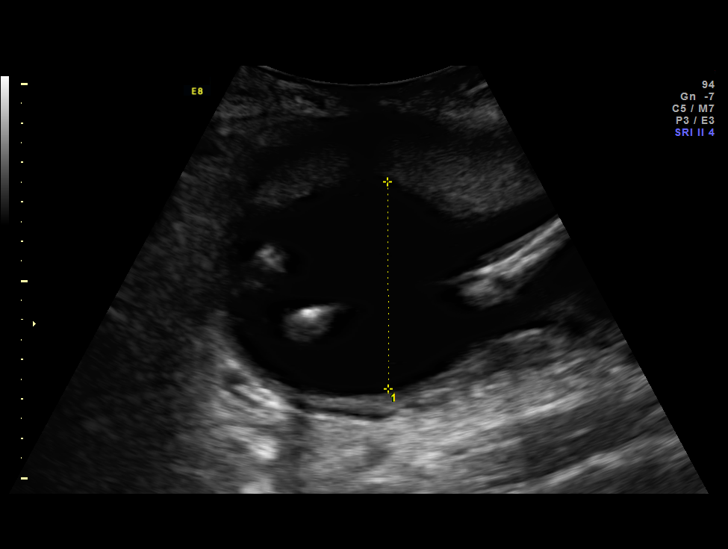
[im 26/88]
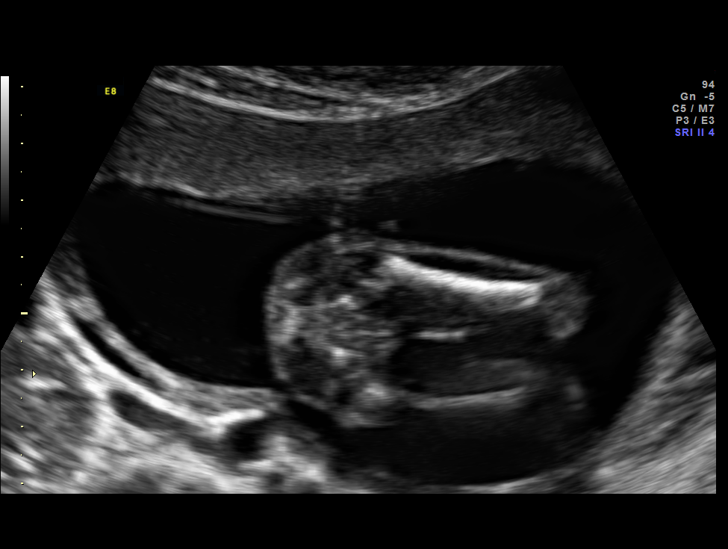
[im 33/88]
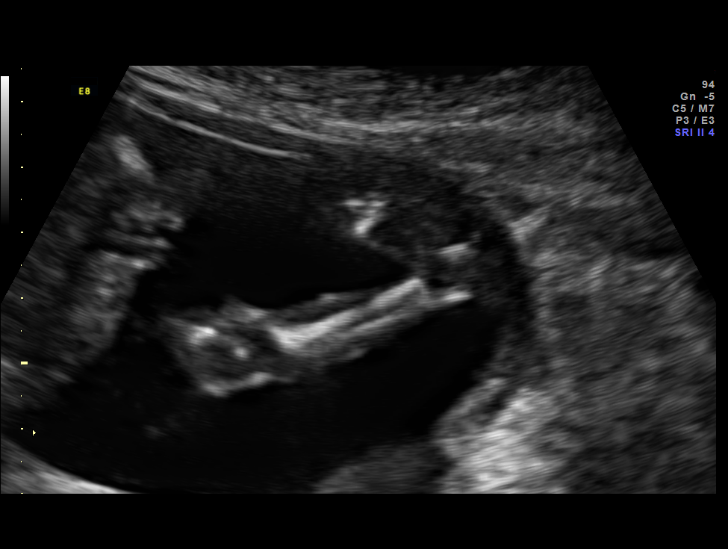
[im 39/88]
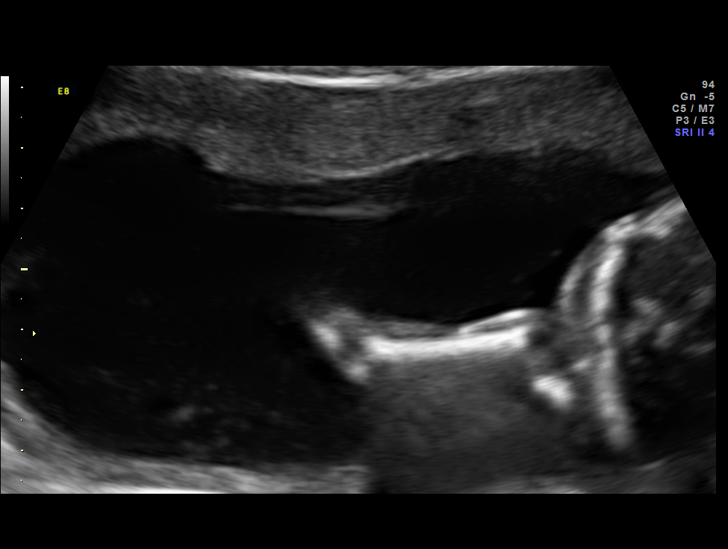
[im 49/88]
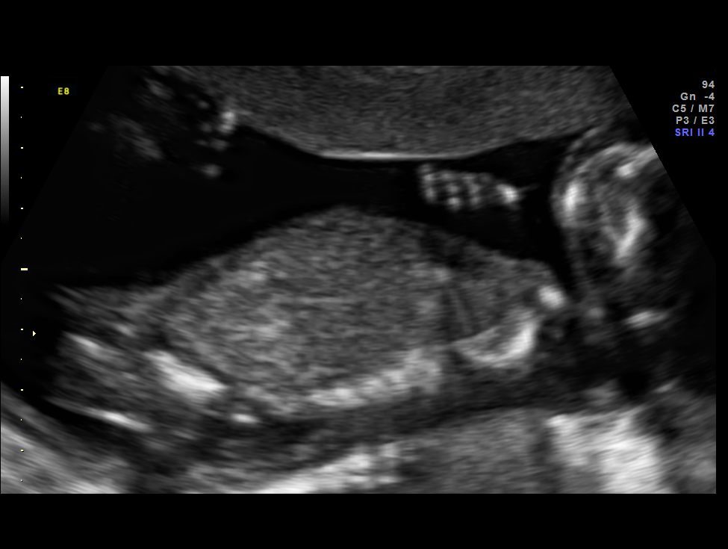
[im 55/88]
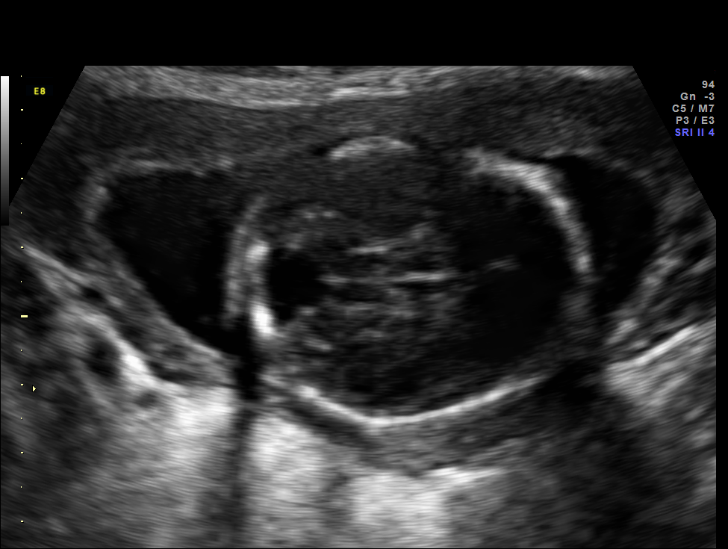
[im 62/88]
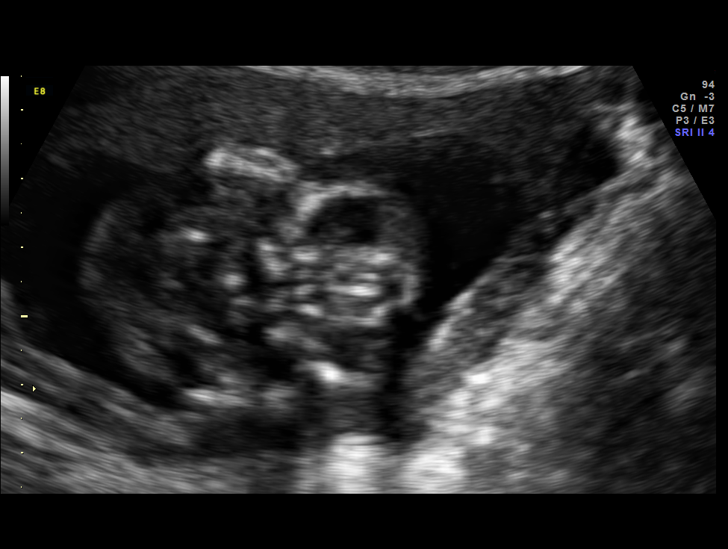
[im 71/88]
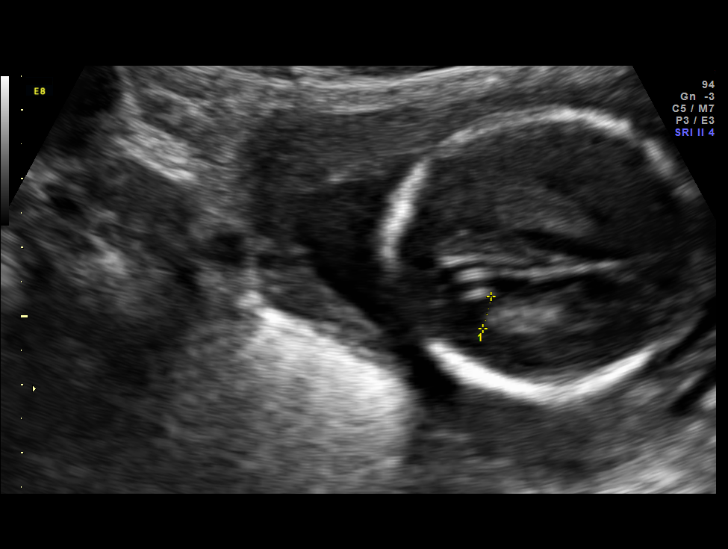
[im 78/88]
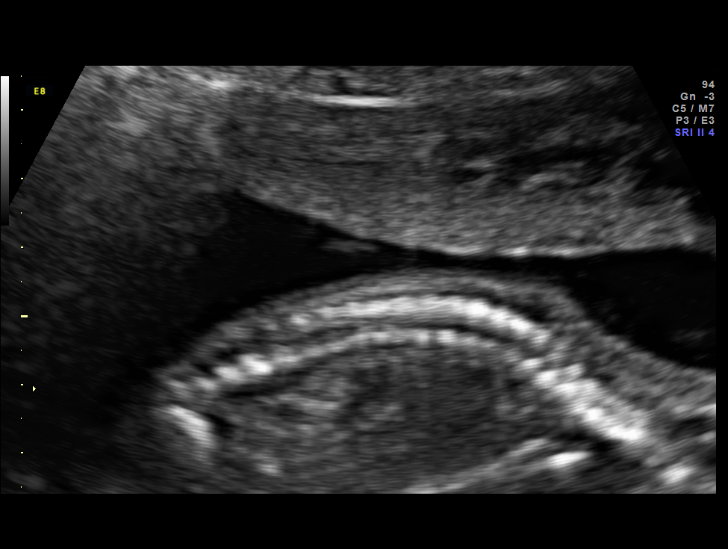
[im 84/88]
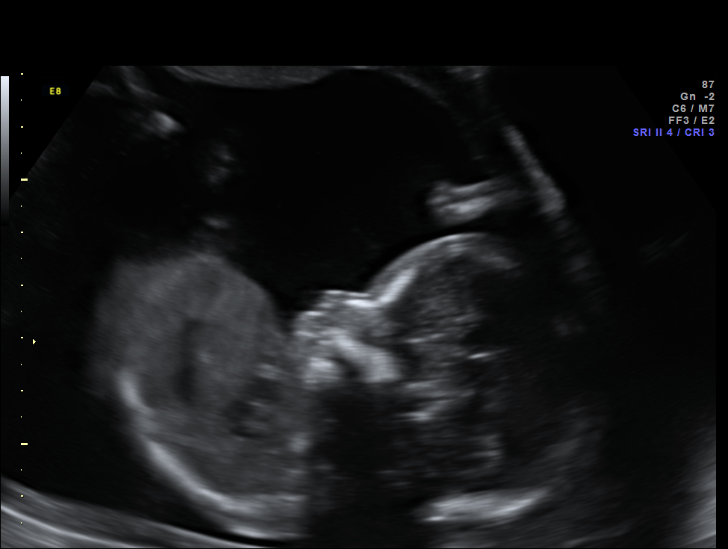

[12 of 28 positions shown; findings below may reference images not displayed]

OBSTETRICS REPORT
                      (Signed Final 12/21/2013 [DATE])

Service(s) Provided

 US OB DETAIL + 14 WK                                  76811.0
Indications

 Detailed fetal anatomic survey                        Z36
 Fetal abnormality - other known or suspected
 (Dandy Walker)
 Previous cesarean section
 18 weeks gestation of pregnancy
 Poor obstetric history: Previous preterm delivery 31
 weeks
Fetal Evaluation

 Num Of Fetuses:    1
 Fetal Heart Rate:  141                          bpm
 Cardiac Activity:  Observed
 Presentation:      Cephalic
 Placenta:          Anterior, above cervical os
 P. Cord            Visualized
 Insertion:

 Amniotic Fluid
 AFI FV:      Subjectively within normal limits
                                             Larg Pckt:     5.3  cm
Biometry

 BPD:     41.1  mm     G. Age:  18w 3d                CI:         74.2   70 - 86
 OFD:     55.4  mm                                    FL/HC:      18.0   15.8 -
                                                                         18
 HC:       157  mm     G. Age:  18w 6d       58  %    HC/AC:      1.26   1.07 -

 AC:     124.8  mm     G. Age:  18w 1d       41  %    FL/BPD:
 FL:      28.2  mm     G. Age:  18w 5d       57  %    FL/AC:      22.6   20 - 24
 HUM:     26.8  mm     G. Age:  18w 4d       60  %
 CER:     19.7  mm     G. Age:  18w 6d       69  %
 NFT:      2.7  mm

 Est. FW:     237  gm      0 lb 8 oz     49  %
Gestational Age

 LMP:           18w 2d        Date:  08/15/13                 EDD:   05/22/14
 U/S Today:     18w 4d                                        EDD:   05/20/14
 Best:          18w 2d     Det. By:  LMP  (08/15/13)          EDD:   05/22/14
Anatomy

 Cranium:          Appears normal         Aortic Arch:      Not well visualized
 Fetal Cavum:      Appears normal         Ductal Arch:      Appears normal
 Ventricles:       Appears normal         Diaphragm:        Appears normal
 Choroid Plexus:   Appears normal         Stomach:          Appears normal, left
                                                            sided
 Cerebellum:       Appears normal         Abdomen:          Appears normal
 Posterior Fossa:  See comments           Abdominal Wall:   Appears nml (cord
                                                            insert, abd wall)
 Nuchal Fold:      Appears normal         Cord Vessels:     Appears normal (3
                                                            vessel cord)
 Face:             Appears normal         Kidneys:          Appear normal
                   (orbits and profile)
 Lips:             Appears normal         Bladder:          Appears normal
 Heart:            Appears normal         Spine:            Appears normal
                   (4CH, axis, and
                   situs)
 RVOT:             Appears normal         Lower             Appears normal
                                          Extremities:
 LVOT:             Appears normal         Upper             Appears normal
                                          Extremities:

 Other:  Heels and 5th digit appear normal. Fetus appears to be a female.
Cervix Uterus Adnexa

 Cervical Length:    2.8      cm

 Cervix:       Normal appearance by transabdominal scan. Appears
               closed, without funnelling.
 Left Ovary:    Within normal limits.
 Right Ovary:   Within normal limits.
Impression

 SIUP at 18+2 weeks
 Hypoplasia of the inferior cerebellar vermis; appeared to be
 an isolated finding
 All other detailed fetal anatomy was seen and appeared
 normal; limited views of AA
 Markers of aneuploidy: above
 Normal amniotic fluid volume
 Measurements consistent with LMP dating

 The US findings were shared with Ms. Ghadoor and her
 husband. The implications of vermian hypoplasia were
 discussed in detail. Prognosis is highly variable but recent
 studies seem to show that the majority of children have
 normal development and 
 25 %  with mild differences. All of
 their prenatal testing and pregnancy management options
 were reviewed including cell free DNA screening,
 amniocentesis for karyotype and microarray, fetal ECHO and
 MRI, expectant management and termination. After careful
 consideration, they declined all further investigation except
 for a follow-up US looking for more development of the
 vermis.
Recommendations

 Follow-up ultrasound in 3-4 weeks to reassess intracranial
 anatomy
 questions or concerns.

## 2017-05-26 ENCOUNTER — Ambulatory Visit: Payer: 59 | Admitting: Pulmonary Disease

## 2017-05-26 ENCOUNTER — Encounter: Payer: Self-pay | Admitting: Pulmonary Disease

## 2017-05-26 ENCOUNTER — Other Ambulatory Visit (INDEPENDENT_AMBULATORY_CARE_PROVIDER_SITE_OTHER): Payer: 59

## 2017-05-26 VITALS — BP 126/78 | HR 84 | Ht 60.0 in | Wt 141.0 lb

## 2017-05-26 DIAGNOSIS — R0602 Shortness of breath: Secondary | ICD-10-CM

## 2017-05-26 LAB — CBC WITH DIFFERENTIAL/PLATELET
BASOS ABS: 0 10*3/uL (ref 0.0–0.1)
BASOS PCT: 0.7 % (ref 0.0–3.0)
EOS ABS: 0.1 10*3/uL (ref 0.0–0.7)
Eosinophils Relative: 1.3 % (ref 0.0–5.0)
HCT: 40.1 % (ref 36.0–46.0)
HEMOGLOBIN: 13 g/dL (ref 12.0–15.0)
LYMPHS PCT: 30.9 % (ref 12.0–46.0)
Lymphs Abs: 2.2 10*3/uL (ref 0.7–4.0)
MCHC: 32.4 g/dL (ref 30.0–36.0)
MCV: 81.9 fl (ref 78.0–100.0)
Monocytes Absolute: 0.7 10*3/uL (ref 0.1–1.0)
Monocytes Relative: 9.5 % (ref 3.0–12.0)
Neutro Abs: 4 10*3/uL (ref 1.4–7.7)
Neutrophils Relative %: 57.6 % (ref 43.0–77.0)
Platelets: 204 10*3/uL (ref 150.0–400.0)
RBC: 4.89 Mil/uL (ref 3.87–5.11)
RDW: 12.8 % (ref 11.5–15.5)
WBC: 7 10*3/uL (ref 4.0–10.5)

## 2017-05-26 LAB — NITRIC OXIDE: Nitric Oxide: 5

## 2017-05-26 MED ORDER — ALBUTEROL SULFATE HFA 108 (90 BASE) MCG/ACT IN AERS: 2.0000 | INHALATION_SPRAY | Freq: Four times a day (QID) | RESPIRATORY_TRACT | 6 refills | Status: DC | PRN

## 2017-05-26 MED ORDER — CHLORPHENIRAMINE MALEATE 4 MG PO TABS: ORAL_TABLET | ORAL | 0 refills | Status: DC

## 2017-05-26 MED ORDER — OMEPRAZOLE 20 MG PO CPDR: 20.0000 mg | DELAYED_RELEASE_CAPSULE | Freq: Every day | ORAL | 11 refills | Status: DC

## 2017-05-26 NOTE — Patient Instructions (Addendum)
Recheck a CBC differential, blood allergy profile today Schedule you for pulmonary function test Start chlorpheniramine 8 mg 3 times daily and spray.  Continue Flonase We will start you on Prilosec 20 mg twice daily for acid reflux We will restart you on albuterol inhaler as needed and give you a prescription Follow-up in 2 to 4 weeks.

## 2017-05-26 NOTE — Progress Notes (Signed)
Heidi Collins    [id number]    10/07/1980  Primary Care Physician:Reese, Jocelyn Lamer, MD  Referring Physician: Leilani Able, MD [address] [place], [place] [address]  Chief complaint: Consult for cough, dyspnea  HPI: 37 year old with past medical history of allergies, sinusitis.  Complains of chest tightness, dyspnea, nonproductive cough on and off for the past 7 years.  This is worsened for the past 1 month. Given albuterol by her primary care which improved symptoms Reports worsening chest tightness when exposed to cleaning products, exercise, smoke, stress.  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  No relevant travel Relevant family history: None  Outpatient Encounter Medications as of 05/26/2017  Medication Sig  . albuterol (PROVENTIL HFA;VENTOLIN HFA) 108 (90 BASE) MCG/ACT inhaler Inhale 2 puffs into the lungs every 6 (six) hours as needed for wheezing or shortness of breath.  . cetirizine (ZYRTEC ALLERGY) 10 MG tablet Take 10 mg by mouth daily.  Marland Kitchen ibuprofen (ADVIL,MOTRIN) 600 MG tablet Take 1 tablet (600 mg total) by mouth every 6 (six) hours as needed for mild pain.  . [DISCONTINUED] oxyCODONE-acetaminophen (PERCOCET/ROXICET) 5-325 MG per tablet Take 1 tablet by mouth every 4 (four) hours as needed (for pain scale 4-7).  . [DISCONTINUED] Prenatal Vit-Fe Fumarate-FA (PRENATAL VITAMIN PO) Take 1 tablet by mouth daily.   . SPRINTEC 28 0.25-35 MG-MCG tablet    No facility-administered encounter medications on file as of 05/26/2017.     Allergies as of 05/26/2017  . (No Known Allergies)    Past Medical History:  Diagnosis Date  . Environmental allergies   . Medical history non-contributory   . Postpartum care following cesarean delivery (3/31) 05/02/2014    Past Surgical History:  Procedure Laterality Date  . CESAREAN  SECTION    . CESAREAN SECTION N/A 05/02/2014   Procedure: Repeat CESAREAN SECTION;  Surgeon: Shea Evans, MD;  Location: [facility];  Service: Obstetrics;  Laterality: N/A;  EDD: 05/22/14    Family History  Problem Relation Age of Onset  . Colon cancer Maternal Grandmother   . Breast cancer Maternal Grandmother   . Hypertension Maternal Grandmother     Social History   Socioeconomic History  . Marital status: Married    Spouse name: Not on file  . Number of children: Not on file  . Years of education: Not on file  . Highest education level: Not on file  Occupational History  . Not on file  Social Needs  . Financial resource strain: Not on file  . Food insecurity:    Worry: Not on file    Inability: Not on file  . Transportation needs:    Medical: Not on file    Non-medical: Not on file  Tobacco Use  . Smoking status: Never Smoker  . Smokeless tobacco: Never Used  Substance and Sexual Activity  . Alcohol use: No  . Drug use: No  . Sexual activity: Yes    Birth control/protection: Pill  Lifestyle  . Physical activity:    Days per week: Not on file    Minutes per session: Not on file  . Stress: Not on file  Relationships  . Social connections:    Talks on phone: Not on file    Gets together: Not on file    Attends religious service: Not on file    Active  member of club or organization: Not on file    Attends meetings of clubs or organizations: Not on file    Relationship status: Not on file  . Intimate partner violence:    Fear of current or ex partner: Not on file    Emotionally abused: Not on file    Physically abused: Not on file    Forced sexual activity: Not on file  Other Topics Concern  . Not on file  Social History Narrative  . Not on file    Review of systems: Review of Systems  Constitutional: Negative for fever and chills.  HENT: Negative.   Eyes: Negative for blurred vision.  Respiratory: as per HPI  Cardiovascular: Negative for chest pain and  palpitations.  Gastrointestinal: Negative for vomiting, diarrhea, blood per rectum. Genitourinary: Negative for dysuria, urgency, frequency and hematuria.  Musculoskeletal: Negative for myalgias, back pain and joint pain.  Skin: Negative for itching and rash.  Neurological: Negative for dizziness, tremors, focal weakness, seizures and loss of consciousness.  Endo/Heme/Allergies: Negative for environmental allergies.  Psychiatric/Behavioral: Negative for depression, suicidal ideas and hallucinations.  All other systems reviewed and are negative.  Physical Exam: Blood pressure 126/78, pulse 84, height 5' (1.524 m), weight 141 lb (64 kg), SpO2 100 %, unknown if currently breastfeeding. Gen:      No acute distress HEENT:  EOMI, sclera anicteric Neck:     No masses; no thyromegaly Lungs:    Clear to auscultation bilaterally; normal respiratory effort CV:         Regular rate and rhythm; no murmurs Abd:      + bowel sounds; soft, non-tender; no palpable masses, no distension Ext:    No edema; adequate peripheral perfusion Skin:      Warm and dry; no rash Neuro: alert and oriented x 3 Psych: normal mood and affect  Data Reviewed: FENO 05/26/2017-5  Chest x-ray 01/29/2009-no acute cardio pulmonary disease.  Reviewed images personally.  Assessment:  Assessment for dyspnea, chest tightness Suspect reactive airway disease.  Probably exacerbated by allergic rhinitis, postnasal drip and silent GERD Scheduled for pulmonary function tests, check CBC with differential and blood allergy profile Restart her on albuterol as needed.  Start chlorpheniramine 8 mg 3 times daily, Flonase Start Prilosec 20 mg twice daily for empiric treatment of acid reflux. Reassess in 2 to 4 weeks and determine if she needs to start a controller medication  Plan/Recommendations: - CBC with differential, blood allergy profile - PFTs - Chlorphentermine, Flonase - Prilosec for acid reflux - Albuterol as  needed.  Chilton GreathousePraveen Shakina Choy MD [facility] 05/26/2017, 2:55 PM  CC: [name], [name], MD

## 2017-06-02 ENCOUNTER — Telehealth: Payer: Self-pay | Admitting: Pulmonary Disease

## 2017-06-02 NOTE — Telephone Encounter (Signed)
Called and spoke with pt letting her know the results of her labwork.  Pt expressed understanding. Nothing further needed at this time. 

## 2017-06-30 ENCOUNTER — Ambulatory Visit (INDEPENDENT_AMBULATORY_CARE_PROVIDER_SITE_OTHER): Payer: 59 | Admitting: Pulmonary Disease

## 2017-06-30 ENCOUNTER — Encounter: Payer: Self-pay | Admitting: Pulmonary Disease

## 2017-06-30 ENCOUNTER — Ambulatory Visit: Payer: 59 | Admitting: Pulmonary Disease

## 2017-06-30 VITALS — BP 132/78 | HR 78 | Ht 60.0 in | Wt 140.0 lb

## 2017-06-30 DIAGNOSIS — R0602 Shortness of breath: Secondary | ICD-10-CM | POA: Diagnosis not present

## 2017-06-30 LAB — PULMONARY FUNCTION TEST
DL/VA % pred: 160 %
DL/VA: 6.8 ml/min/mmHg/L
DLCO COR % PRED: 131 %
DLCO COR: 24.76 ml/min/mmHg
DLCO UNC % PRED: 129 %
DLCO UNC: 24.45 ml/min/mmHg
FEF 25-75 POST: 3.22 L/s
FEF 25-75 PRE: 2.63 L/s
FEF2575-%Change-Post: 22 %
FEF2575-%PRED-PRE: 97 %
FEF2575-%Pred-Post: 119 %
FEV1-%Change-Post: 3 %
FEV1-%PRED-POST: 104 %
FEV1-%Pred-Pre: 101 %
FEV1-POST: 2.37 L
FEV1-Pre: 2.29 L
FEV1FVC-%CHANGE-POST: 5 %
FEV1FVC-%Pred-Pre: 101 %
FEV6-%CHANGE-POST: -2 %
FEV6-%PRED-POST: 99 %
FEV6-%PRED-PRE: 101 %
FEV6-PRE: 2.69 L
FEV6-Post: 2.63 L
FEV6FVC-%PRED-PRE: 102 %
FEV6FVC-%Pred-Post: 102 %
FVC-%Change-Post: -2 %
FVC-%PRED-POST: 97 %
FVC-%Pred-Pre: 99 %
FVC-Post: 2.63 L
FVC-Pre: 2.69 L
POST FEV6/FVC RATIO: 100 %
PRE FEV1/FVC RATIO: 85 %
Post FEV1/FVC ratio: 90 %
Pre FEV6/FVC Ratio: 100 %
RV % pred: 94 %
RV: 1.23 L
TLC % PRED: 88 %
TLC: 3.92 L

## 2017-06-30 NOTE — Progress Notes (Signed)
PFT done today. 

## 2017-06-30 NOTE — Patient Instructions (Addendum)
I am glad that you are feeling better. Your lung function tests are normal which is good news. Going forward you can use the chlorpheniramine, Flonase as needed Continue Prilosec for acid reflux  Follow-up as needed.

## 2017-06-30 NOTE — Progress Notes (Signed)
Heidi Collins    [id number]    03/11/1980  Primary Care Physician:Reese, Jocelyn Lamer, MD  Referring Physician: Leilani Able, MD [address] [place], [place] [address]  Chief complaint: Follow-up for cough  HPI: 37 year old with past medical history of allergies, sinusitis.  Complains of chest tightness, dyspnea, nonproductive cough on and off for the past 7 years.  This is worsened for the past 1 month. Given albuterol by her primary care which improved symptoms Reports worsening chest tightness when exposed to cleaning products, exercise, smoke, stress.  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  No relevant travel Relevant family history: None  Interim history: Cough has resolved with initiation of chlorpheniramine, Flonase and Prilosec.  She states that her dyspnea is much better.  She hardly needs to use her albuterol inhaler.  Outpatient Encounter Medications as of 06/30/2017  Medication Sig  . albuterol (PROVENTIL HFA;VENTOLIN HFA) 108 (90 Base) MCG/ACT inhaler Inhale 2 puffs into the lungs every 6 (six) hours as needed for wheezing or shortness of breath.  . chlorpheniramine (CHLOR-TRIMETON) 4 MG tablet Take  3 times daily  . ibuprofen (ADVIL,MOTRIN) 600 MG tablet Take 1 tablet (600 mg total) by mouth every 6 (six) hours as needed for mild pain.  Marland Kitchen omeprazole (PRILOSEC) 20 MG capsule Take 1 capsule (20 mg total) by mouth daily.  . SPRINTEC 28 0.25-35 MG-MCG tablet   . [DISCONTINUED] albuterol (PROVENTIL HFA;VENTOLIN HFA) 108 (90 BASE) MCG/ACT inhaler Inhale 2 puffs into the lungs every 6 (six) hours as needed for wheezing or shortness of breath.  . [DISCONTINUED] cetirizine (ZYRTEC ALLERGY) 10 MG tablet Take 10 mg by mouth daily.   No facility-administered encounter medications on file as of 06/30/2017.     Allergies as of  06/30/2017  . (No Known Allergies)    Past Medical History:  Diagnosis Date  . Environmental allergies   . Medical history non-contributory   . Postpartum care following cesarean delivery (3/31) 05/02/2014    Past Surgical History:  Procedure Laterality Date  . CESAREAN SECTION    . CESAREAN SECTION N/A 05/02/2014   Procedure: Repeat CESAREAN SECTION;  Surgeon: Shea Evans, MD;  Location: [facility];  Service: Obstetrics;  Laterality: N/A;  EDD: 05/22/14    Family History  Problem Relation Age of Onset  . Colon cancer Maternal Grandmother   . Breast cancer Maternal Grandmother   . Hypertension Maternal Grandmother     Social History   Socioeconomic History  . Marital status: Married    Spouse name: Not on file  . Number of children: Not on file  . Years of education: Not on file  . Highest education level: Not on file  Occupational History  . Not on file  Social Needs  . Financial resource strain: Not on file  . Food insecurity:    Worry: Not on file    Inability: Not on file  . Transportation needs:    Medical: Not on file    Non-medical: Not on file  Tobacco Use  . Smoking status: Never Smoker  . Smokeless tobacco: Never Used  Substance and Sexual Activity  . Alcohol use: No  . Drug use: No  . Sexual activity: Yes    Birth control/protection: Pill  Lifestyle  . Physical activity:    Days per week: Not on file  Minutes per session: Not on file  . Stress: Not on file  Relationships  . Social connections:    Talks on phone: Not on file    Gets together: Not on file    Attends religious service: Not on file    Active member of club or organization: Not on file    Attends meetings of clubs or organizations: Not on file    Relationship status: Not on file  . Intimate partner violence:    Fear of current or ex partner: Not on file    Emotionally abused: Not on file    Physically abused: Not on file    Forced sexual activity: Not on file  Other Topics  Concern  . Not on file  Social History Narrative  . Not on file    Review of systems: Review of Systems  Constitutional: Negative for fever and chills.  HENT: Negative.   Eyes: Negative for blurred vision.  Respiratory: as per HPI  Cardiovascular: Negative for chest pain and palpitations.  Gastrointestinal: Negative for vomiting, diarrhea, blood per rectum. Genitourinary: Negative for dysuria, urgency, frequency and hematuria.  Musculoskeletal: Negative for myalgias, back pain and joint pain.  Skin: Negative for itching and rash.  Neurological: Negative for dizziness, tremors, focal weakness, seizures and loss of consciousness.  Endo/Heme/Allergies: Negative for environmental allergies.  Psychiatric/Behavioral: Negative for depression, suicidal ideas and hallucinations.  All other systems reviewed and are negative.  Physical Exam: Blood pressure 132/78, pulse 78, height 5' (1.524 m), weight 140 lb (63.5 kg), SpO2 99 %, unknown if currently breastfeeding. Gen:      No acute distress HEENT:  EOMI, sclera anicteric Neck:     No masses; no thyromegaly Lungs:    Clear to auscultation bilaterally; normal respiratory effort CV:         Regular rate and rhythm; no murmurs Abd:      + bowel sounds; soft, non-tender; no palpable masses, no distension Ext:    No edema; adequate peripheral perfusion Skin:      Warm and dry; no rash Neuro: alert and oriented x 3 Psych: normal mood and affect  Data Reviewed: FENO 05/26/2017-5  Chest x-ray 01/29/2009-no acute cardio pulmonary disease.  Reviewed images personally.  CBC 05/26/2017-WBC 7, eos 1.3%, absolute eosinophil count 100  PFTs 06/30/2017 FVC 2.63 [97%], FEV1 2.37 [104%], F/F 90, TLC 88%, DLCO 129% Increased diffusion capacity  Assessment:  Mild intermittent asthma PFTs reviewed with no obstruction.  There may be indication of small airways disease shown by curvature to the flow loop No need for any controller medication as symptoms  are stable Continue albuterol as needed She can follow-up with primary care for this and return to pulmonary as needed  Upper airway cough Secondary to allergic rhinitis, postnasal drip and possibly silent GERD Continue chlorphentermine, Flonase.  She can use these as needed going forward Continue Prilosec for acid reflux.  Plan/Recommendations: - Albuterol as needed. - Return to pulmonary clinic as needed  Chilton Greathouse MD [facility] 06/30/2017, 11:10 AM  CC: Leilani Able, MD

## 2017-10-13 ENCOUNTER — Encounter: Payer: Self-pay | Admitting: Pulmonary Disease

## 2017-10-13 ENCOUNTER — Ambulatory Visit: Payer: 59 | Admitting: Pulmonary Disease

## 2017-10-13 VITALS — BP 124/76 | HR 77 | Ht 60.0 in | Wt 138.0 lb

## 2017-10-13 DIAGNOSIS — J4530 Mild persistent asthma, uncomplicated: Secondary | ICD-10-CM

## 2017-10-13 DIAGNOSIS — Z23 Encounter for immunization: Secondary | ICD-10-CM | POA: Diagnosis not present

## 2017-10-13 NOTE — Patient Instructions (Signed)
I am glad that your breathing is improved after initiation of Advair We will continue it for now Follow-up in 3 months We will give a flu shot today.

## 2017-10-13 NOTE — Progress Notes (Signed)
CHARLOTTE FIDALGO    [id number]    1980/04/13  Primary Care Physician:Reese, Jocelyn Lamer, MD  Referring Physician: Leilani Able, MD [address] [place], [place] [address]  Chief complaint: Follow-up for cough, mild persistent asthma  HPI: 37 year old with past medical history of allergies, sinusitis.  Complains of chest tightness, dyspnea, nonproductive cough on and off for the past 7 years.  This is worsened for the past 1 month. Given albuterol by her primary care which improved symptoms Reports worsening chest tightness when exposed to cleaning products, exercise, smoke, stress.  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  No relevant travel Relevant family history: None  Interim history: Discharge from pulmonary clinic at last visit since she is doing well with no need for controller medication 2 weeks ago she started getting more short of breath, using the albuterol 3 times a day with chest tightness, wheezing  Started on Advair 250 by her primary care with marked improvement in symptoms.  Physical Exam: Blood pressure 124/76, pulse 77, height 5' (1.524 m), weight 138 lb (62.6 kg), SpO2 98 %, unknown if currently breastfeeding. Gen:      No acute distress HEENT:  EOMI, sclera anicteric Neck:     No masses; no thyromegaly Lungs:    Clear to auscultation bilaterally; normal respiratory effort CV:         Regular rate and rhythm; no murmurs Abd:      + bowel sounds; soft, non-tender; no palpable masses, no distension Ext:    No edema; adequate peripheral perfusion Skin:      Warm and dry; no rash Neuro: alert and oriented x 3 Psych: normal mood and affect  Data Reviewed: FENO 05/26/2017-5  Chest x-ray 01/29/2009-no acute cardio pulmonary disease.  Reviewed images personally.  CBC 05/26/2017-WBC 7, eos 1.3%, absolute eosinophil  count 100  PFTs 06/30/2017 FVC 2.63 [97%], FEV1 2.37 [104%], F/F 90, TLC 88%, DLCO 129% Increased diffusion capacity  Assessment:  Mild persistent asthma PFTs reviewed with no obstruction.  There may be indication of small airways disease shown by curvature to the flow loop  No new controller medication should she had worsening of her breathing Continue Advair for now as she has responded very well to it Albuterol as needed.  Upper airway cough Secondary to allergic rhinitis, postnasal drip and possibly silent GERD Continue chlorphentermine, Flonase. Continue Prilosec for acid reflux.  Plan/Recommendations: - Advair, albuterol as needed. - Follow-up in 3 months.  Outpatient Encounter Medications as of 10/13/2017  Medication Sig  . ADVAIR DISKUS 250-50 MCG/DOSE AEPB INHALE 2 PUFFS INTO THE LUNGS EVERY DAY  . albuterol (PROVENTIL HFA;VENTOLIN HFA) 108 (90 Base) MCG/ACT inhaler Inhale 2 puffs into the lungs every 6 (six) hours as needed for wheezing or shortness of breath.  . chlorpheniramine (CHLOR-TRIMETON) 4 MG tablet Take 8mg  3 times daily  . omeprazole (PRILOSEC) 20 MG capsule Take 1 capsule (20 mg total) by mouth daily.  . SPRINTEC 28 0.25-35 MG-MCG tablet   . [DISCONTINUED] ibuprofen (ADVIL,MOTRIN) 600 MG tablet Take 1 tablet (600 mg total) by mouth every 6 (six) hours as needed for mild pain.   No facility-administered encounter medications on file as of 10/13/2017.     Allergies as of 10/13/2017  . (No Known Allergies)    Past Medical History:  Diagnosis Date  . Environmental allergies   .  Medical history non-contributory   . Postpartum care following cesarean delivery (3/31) 05/02/2014    Past Surgical History:  Procedure Laterality Date  . CESAREAN SECTION    . CESAREAN SECTION N/A 05/02/2014   Procedure: Repeat CESAREAN SECTION;  Surgeon: Shea EvansVaishali Mody, MD;  Location: [facility];  Service: Obstetrics;  Laterality: N/A;  EDD: 05/22/14    Family History  Problem  Relation Age of Onset  . Colon cancer Maternal Grandmother   . Breast cancer Maternal Grandmother   . Hypertension Maternal Grandmother     Social History   Socioeconomic History  . Marital status: Married    Spouse name: Not on file  . Number of children: Not on file  . Years of education: Not on file  . Highest education level: Not on file  Occupational History  . Not on file  Social Needs  . Financial resource strain: Not on file  . Food insecurity:    Worry: Not on file    Inability: Not on file  . Transportation needs:    Medical: Not on file    Non-medical: Not on file  Tobacco Use  . Smoking status: Never Smoker  . Smokeless tobacco: Never Used  Substance and Sexual Activity  . Alcohol use: No  . Drug use: No  . Sexual activity: Yes    Birth control/protection: Pill  Lifestyle  . Physical activity:    Days per week: Not on file    Minutes per session: Not on file  . Stress: Not on file  Relationships  . Social connections:    Talks on phone: Not on file    Gets together: Not on file    Attends religious service: Not on file    Active member of club or organization: Not on file    Attends meetings of clubs or organizations: Not on file    Relationship status: Not on file  . Intimate partner violence:    Fear of current or ex partner: Not on file    Emotionally abused: Not on file    Physically abused: Not on file    Forced sexual activity: Not on file  Other Topics Concern  . Not on file  Social History Narrative  . Not on file   Review of systems: Review of Systems  Constitutional: Negative for fever and chills.  HENT: Negative.   Eyes: Negative for blurred vision.  Respiratory: as per HPI  Cardiovascular: Negative for chest pain and palpitations.  Gastrointestinal: Negative for vomiting, diarrhea, blood per rectum. Genitourinary: Negative for dysuria, urgency, frequency and hematuria.  Musculoskeletal: Negative for myalgias, back pain and joint  pain.  Skin: Negative for itching and rash.  Neurological: Negative for dizziness, tremors, focal weakness, seizures and loss of consciousness.  Endo/Heme/Allergies: Negative for environmental allergies.  Psychiatric/Behavioral: Negative for depression, suicidal ideas and hallucinations.  All other systems reviewed and are negative.  Chilton GreathousePraveen Faithe Ariola MD [facility] 10/13/2017, 11:20 AM  CC: [name], [name], MD

## 2018-01-18 ENCOUNTER — Ambulatory Visit: Payer: 59 | Admitting: Pulmonary Disease

## 2018-02-23 ENCOUNTER — Encounter: Payer: Self-pay | Admitting: Pulmonary Disease

## 2018-02-23 ENCOUNTER — Ambulatory Visit (INDEPENDENT_AMBULATORY_CARE_PROVIDER_SITE_OTHER): Payer: 59 | Admitting: Pulmonary Disease

## 2018-02-23 VITALS — BP 118/64 | HR 67 | Ht 60.0 in | Wt 141.6 lb

## 2018-02-23 DIAGNOSIS — J4530 Mild persistent asthma, uncomplicated: Secondary | ICD-10-CM

## 2018-02-23 NOTE — Progress Notes (Signed)
         Heidi Collins    [id number]    Feb 27, 1980  Primary Care Physician:Reese, Jocelyn Lamer, MD  Referring Physician: Leilani Able, MD [address] [place], [place] [address]  Chief complaint: Follow-up for cough, mild persistent asthma  HPI: 38 year old with past medical history of allergies, sinusitis.  Complains of chest tightness, dyspnea, nonproductive cough on and off for the past 7 years.  This is worsened for the past 1 month. Given albuterol by her primary care which improved symptoms Reports worsening chest tightness when exposed to cleaning products, exercise, smoke, stress.  In 2019 we attempted to take her off controller medication but symptoms flared up.  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  No relevant travel Relevant family history: None  Interim history: Continues on Advair.  Reports some increasing chest tightness on Sunday.  She used albuterol twice with relief States that dyspnea is at baseline currently.  Outpatient Encounter Medications as of 02/23/2018  Medication Sig  . ADVAIR DISKUS 250-50 MCG/DOSE AEPB INHALE 2 PUFFS INTO THE LUNGS EVERY DAY  . albuterol (PROVENTIL HFA;VENTOLIN HFA) 108 (90 Base) MCG/ACT inhaler Inhale 2 puffs into the lungs every 6 (six) hours as needed for wheezing or shortness of breath.  . chlorpheniramine (CHLOR-TRIMETON) 4 MG tablet Take 8mg  3 times daily  . SPRINTEC 28 0.25-35 MG-MCG tablet   . [DISCONTINUED] omeprazole (PRILOSEC) 20 MG capsule Take 1 capsule (20 mg total) by mouth daily.   No facility-administered encounter medications on file as of 02/23/2018.    Physical Exam: Blood pressure 118/64, pulse 67, height 5' (1.524 m), weight 141 lb 9.6 oz (64.2 kg), SpO2 100 %, unknown if currently breastfeeding. Gen:      No acute  distress HEENT:  EOMI, sclera anicteric Neck:     No masses; no thyromegaly Lungs:    Clear to auscultation bilaterally; normal respiratory effort CV:         Regular rate and rhythm; no murmurs Abd:      + bowel sounds; soft, non-tender; no palpable masses, no distension Ext:    No edema; adequate peripheral perfusion Skin:      Warm and dry; no rash Neuro: alert and oriented x 3 Psych: normal mood and affect  Data Reviewed: FENO 05/26/2017-5  Chest x-ray 01/29/2009-no acute cardio pulmonary disease.  Reviewed images personally.  CBC 05/26/2017-WBC 7, eos 1.3%, absolute eosinophil count 100  PFTs 06/30/2017 FVC 2.63 [97%], FEV1 2.37 [104%], F/F 90, TLC 88%, DLCO 129% Increased diffusion capacity  Assessment:  Mild persistent asthma PFTs reviewed with no obstruction.  There may be indication of small airways disease shown by curvature to the flow loop  Continue Advair for now as she has responded very well to it Albuterol as needed.  Upper airway cough Secondary to allergic rhinitis, postnasal drip and possibly silent GERD Continue chlorphentermine, Flonase. Continue Prilosec as needed  Plan/Recommendations: - Advair, albuterol as needed. - Follow-up in 1 year  Chilton Greathouse MD [facility] 02/23/2018, 10:52 AM  CC: Leilani Able, MD

## 2018-02-23 NOTE — Patient Instructions (Signed)
Continue your Advair and albuterol as needed Follow-up in 1 year Please give [place] a call sooner if there is any change in his symptoms.

## 2018-04-21 ENCOUNTER — Telehealth: Payer: Self-pay | Admitting: Pulmonary Disease

## 2018-04-21 MED ORDER — ALBUTEROL SULFATE HFA 108 (90 BASE) MCG/ACT IN AERS: 2.0000 | INHALATION_SPRAY | Freq: Four times a day (QID) | RESPIRATORY_TRACT | 6 refills | Status: DC | PRN

## 2018-04-21 NOTE — Telephone Encounter (Signed)
Spoke to pt.  Sent albuterol refill to preferred pharmacy.  Nothing further needed.

## 2018-05-11 ENCOUNTER — Telehealth: Payer: Self-pay | Admitting: Pulmonary Disease

## 2018-05-11 ENCOUNTER — Encounter: Payer: Self-pay | Admitting: General Surgery

## 2018-05-11 NOTE — Telephone Encounter (Signed)
In the letter is it okay to state that the patient can con't to work from and should no longer do home visit due to COVID 19 pandemic. Her job has already approved this but they are asking all high risk patients to get a letter from the doctor's office like herself. Pt would like it to be more specific if possible.   B.Mack please advise if this is okay information to add to pt's letter for work. Thank you.

## 2018-05-11 NOTE — Telephone Encounter (Signed)
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 She is requesting a letter to stating she can work from and should no longer do home visit due to COVID 19 pandemic. Her job has already approved this but are asking all high risk patients to get a letter. Patient states she has asthma and would rather be safe than sorry.   BM please advise. Thanks.

## 2018-05-11 NOTE — Telephone Encounter (Signed)
I am sorry let me be more specific.  I am not going to write the patient out from working or doing home visits.  If her job is already approved her from not doing those things then this should not be that big of a deal. We can absolutely document a letter saying that she is high risk for COVID-19 complication.  In the letter we can explicitly state:  "the patient has asthma which puts her at a higher risk of having complications regarding COVID-19.  It is important for her to practice social distancing as well as wear a mask at all times.  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  At all times the patient needs to be following CDC and WHO guidelines for appropriate personal protective equipment."  Elisha Headland FNP

## 2018-05-11 NOTE — Telephone Encounter (Signed)
Called the patient and read to her the quoted response that can be placed in the letter requested. The patient agreed and requested the letter be sent to her supervisor.  Letter will be sent to the patient in my chart to the attention of:  [facility] [address] [place], [place] [address] Attn: Marylene Buerger  Letter created for patient to forward to employer. Nothing further needed at this time.

## 2018-05-11 NOTE — Telephone Encounter (Signed)
Ok to generate note stating pt as Asthma. Pt still needs to follow CDC guidelines of social distancing and masks in public.   Arlys John

## 2018-05-22 ENCOUNTER — Telehealth: Payer: Self-pay | Admitting: Pulmonary Disease

## 2018-05-22 NOTE — Telephone Encounter (Signed)
Returned call to patient.  States she could not find her letter (date 05/11/18 concerning covid19 high risk) in mychart and requests we send a copy to her home address which was verified and a copy to [facility] Dept (address on letter).  Copies placed in the mail today per request. Nothing further needed.

## 2018-06-12 ENCOUNTER — Telehealth: Payer: Self-pay | Admitting: Pulmonary Disease

## 2018-06-12 NOTE — Telephone Encounter (Signed)
Left message for patient to call back  

## 2018-06-13 ENCOUNTER — Encounter: Payer: Self-pay | Admitting: Primary Care

## 2018-06-13 ENCOUNTER — Ambulatory Visit (INDEPENDENT_AMBULATORY_CARE_PROVIDER_SITE_OTHER): Payer: 59 | Admitting: Primary Care

## 2018-06-13 ENCOUNTER — Other Ambulatory Visit: Payer: Self-pay

## 2018-06-13 DIAGNOSIS — J4521 Mild intermittent asthma with (acute) exacerbation: Secondary | ICD-10-CM | POA: Diagnosis not present

## 2018-06-13 MED ORDER — MONTELUKAST SODIUM 10 MG PO TABS: 10.0000 mg | ORAL_TABLET | Freq: Every day | ORAL | 11 refills | Status: DC

## 2018-06-13 MED ORDER — OMEPRAZOLE 20 MG PO CPDR: 20.0000 mg | DELAYED_RELEASE_CAPSULE | Freq: Every day | ORAL | 11 refills | Status: DC

## 2018-06-13 MED ORDER — PREDNISONE 10 MG PO TABS: ORAL_TABLET | ORAL | 0 refills | Status: DC

## 2018-06-13 NOTE — Patient Instructions (Signed)
Start taking Advair 1 puff twice daily (morning and evening)  RX: Prednisone 20mg  x 5 days Start Singulair 10mg  at bedtime (this can help with inflammation of airways and allergies associated with asthma) Prilosec for GERD symptoms- take daily before breakfast (refill sent)  Follow-up: If symptoms do not improve or worsen Otherwise annually with Dr. Isaiah Serge

## 2018-06-13 NOTE — Progress Notes (Signed)
Virtual Visit via Telephone Note  I connected with Heidi Collins on 06/13/18 at  3:30 PM EDT by telephone and verified that I am speaking with the correct person using two identifiers.  Location: Patient: Home Provider: Office   I discussed the limitations, risks, security and privacy concerns of performing an evaluation and management service by telephone and the availability of in person appointments. I also discussed with the patient that there may be a patient responsible charge related to this service. The patient expressed understanding and agreed to proceed.  History of Present Illness: 38 year old female, never smoked. PMH significant for mild persistent asthma, allergies, sinusitis. Patient of Dr. Isaiah Serge, last seen on 02/23/18. PFTs in May 2019 showed no obstruction. Possible small airway disease shown by curvature to flow loop. Maintained on Advair 250.   06/13/2018 Patient called for acute visit with complaints of shortness of breath and dry cough. States that she has had symptoms since beginning of March. Feels more short of breath upon waking up in the morning. Doesn't feel Advair has been working as well. Reports that she takes 2 puffs in the morning only. Using rescue inhaler more frequently recently (4 times a day). Reports GERD symptoms and eating later at night. Requesting refill Prilosec. Denies fever, flu like illness or sick contact.    Observations/Objective:   - NO shortness of breath, wheezing or cough  Assessment and Plan:  Mild persistent asthma: - Advised patient use Advair 1 puff TWICE daily - RX prednisone 20mg  x 5 days - Adding Singulair 10mg  qhs  GERD - Refill omeprazole 20mg  daily  Follow Up Instructions:   - Follow up if symptoms do not improve or worsen; otherwise annually with Dr. Isaiah Serge  I discussed the assessment and treatment plan with the patient. The patient was provided an opportunity to ask questions and all were answered. The patient agreed  with the plan and demonstrated an understanding of the instructions.   The patient was advised to call back or seek an in-person evaluation if the symptoms worsen or if the condition fails to improve as anticipated.  I provided 15 minutes of non-face-to-face time during this encounter.   Glenford Bayley, NP

## 2018-06-13 NOTE — Telephone Encounter (Signed)
Called and spoke with Patient.  Patient is seen by Dr Isaiah Serge for asthma, last OV 02/23/18. Patient stated it is normal this time of year to have increased SHOB at night, but she is having SHOB in the morning.  Patient stated she is having some chest tightness, with occasional non productive cough. Patient stated she is taking Advair daily, and was concerned that she needed a different inhaler, or needed to start back on acid reflux medications.   Patient denies fever, or chills.  Patient denied OTC medications.   Patient is scheduled with Ames Dura, NP, 06/13/18, at 3:30pm, telephone visit. Nothing further at this time.

## 2018-07-13 ENCOUNTER — Telehealth: Payer: Self-pay | Admitting: Pulmonary Disease

## 2018-07-13 MED ORDER — ADVAIR DISKUS 250-50 MCG/DOSE IN AEPB: 1.0000 | INHALATION_SPRAY | Freq: Two times a day (BID) | RESPIRATORY_TRACT | 5 refills | Status: DC

## 2018-07-13 NOTE — Telephone Encounter (Signed)
Rx advair sent to pt's preferred pharmacy. Called and spoke with pt letting her know this had been done and pt verbalized understanding. Nothing further needed.

## 2018-07-26 ENCOUNTER — Telehealth: Payer: Self-pay | Admitting: Pulmonary Disease

## 2018-07-26 MED ORDER — ALBUTEROL SULFATE HFA 108 (90 BASE) MCG/ACT IN AERS: 2.0000 | INHALATION_SPRAY | Freq: Four times a day (QID) | RESPIRATORY_TRACT | 5 refills | Status: DC | PRN

## 2018-07-26 NOTE — Telephone Encounter (Signed)
Called and spoke with Patient.  Patient requested a new refill for Albuterol to be sent to [facility].  Prescription request sent.  Nothing further at this time.

## 2018-09-08 ENCOUNTER — Telehealth: Payer: Self-pay | Admitting: Pulmonary Disease

## 2018-09-08 MED ORDER — PREDNISONE 10 MG PO TABS: ORAL_TABLET | ORAL | 0 refills | Status: DC

## 2018-09-08 NOTE — Telephone Encounter (Signed)
Called pt due to [place] having a pregnancy status of unknown and also to let her know that Rx for prednisone had been sent but unable to reach. Left message for pt to return call.

## 2018-09-08 NOTE — Telephone Encounter (Signed)
Please confirm that she is not pregnant, sent in rx prednisone taper. If not better needs visit

## 2018-09-08 NOTE — Telephone Encounter (Signed)
Primary Pulmonologist: Dr. Vaughan Browner Last office visit and with whom: 06/13/2018 with Derl Barrow What do we see them for (pulmonary problems): asthma Last OV assessment/plan: Instructions    Return in about 1 year (around 06/13/2019), or if symptoms worsen or fail to improve. Start taking Advair 1 puff twice daily (morning and evening)  RX: Prednisone 20mg  x 5 days Start Singulair 10mg  at bedtime (this can help with inflammation of airways and allergies associated with asthma) Prilosec for GERD symptoms- take daily 69min before breakfast (refill sent)  Follow-up: If symptoms do not improve or worsen Otherwise annually with Dr. Vaughan Browner      Was appointment offered to patient (explain)?  Pt requesting pred Rx   Reason for call: Called and spoke with pt who stated she has been working out this week and did not have her rescue inhaler with her due to inhaler not really helping with her symptoms. Pt said she has had to use it at least 5 times daily and not getting any relief.   Pt does have increased SOB, dry cough, tightness in chest. Pt denies any fever, no wheezing.   Pt is still using advair and taking singulair as prescribed. Pt is requesting a pred Rx to be called in to help with her symptoms. Beth, please advise on this for pt. Thanks!

## 2018-09-11 NOTE — Telephone Encounter (Signed)
Called pt's pharmacy to check to see if pt picked up the pred Rx that was called in. Spoke with Abby at pharmacy who stated that pt picked up the Rx 8/7. Nothing further needed.

## 2018-10-03 ENCOUNTER — Telehealth: Payer: Self-pay | Admitting: Pulmonary Disease

## 2018-10-03 NOTE — Telephone Encounter (Signed)
LMTCB x 1 

## 2018-10-04 ENCOUNTER — Other Ambulatory Visit: Payer: Self-pay

## 2018-10-04 ENCOUNTER — Ambulatory Visit (INDEPENDENT_AMBULATORY_CARE_PROVIDER_SITE_OTHER): Payer: 59 | Admitting: Pulmonary Disease

## 2018-10-04 ENCOUNTER — Encounter: Payer: Self-pay | Admitting: Pulmonary Disease

## 2018-10-04 DIAGNOSIS — J4521 Mild intermittent asthma with (acute) exacerbation: Secondary | ICD-10-CM | POA: Diagnosis not present

## 2018-10-04 MED ORDER — PREDNISONE 10 MG PO TABS: ORAL_TABLET | ORAL | 0 refills | Status: DC

## 2018-10-04 NOTE — Telephone Encounter (Signed)
Pt is returning call. Cb is [phone].

## 2018-10-04 NOTE — Progress Notes (Signed)
Virtual Visit via Telephone Note  I connected with Heidi Collins on 10/04/18 at 11:30 AM EDT by telephone and verified that I am speaking with the correct person using two identifiers.  Location: Patient: Home Provider: Office [facility] - [address], [place], [place],  [address]   I discussed the limitations, risks, security and privacy concerns of performing an evaluation and management service by telephone and the availability of in person appointments. I also discussed with the patient that there may be a patient responsible charge related to this service. The patient expressed understanding and agreed to proceed.  Patient consented to consult via telephone: Yes People present and their role in pt care: Pt    History of Present Illness:  38 year old female never smoker followed in our office for asthma  Past medical history: Allergic rhinitis Smoking history: Never smoker Maintenance: Advair 250 Patient of Dr. Vaughan Browner  Chief complaint: Medication Concerns   38 year old female never smoker followed in our office for asthma.  Patient completed a tele-visit with her office today as she continues to have worsening shortness of breath as well as chest tightness.  She is wondering if this is due to her Advair or if she needs to switch to a different inhaler.  Patient was last seen in office by our practice in January/2020.  Patient was seen telephonically in May/2020 and treated as an asthma exacerbation with a course of steroids.  Patient was then also treated empirically via telephone note in August/2020 with prednisone.  Patient reports that the prednisone does help with her breathing she feels relief by this.  Patient was also having difficulty with being maintained on allergy pill.  She is struggling to get refills of her chlor tabs at the local pharmacy.  Her husband recently purchased her Zyrtec and she started taking that yesterday.  She continues to be maintained  on Singulair.  She is using her rescue inhaler 5-6 times a day since March/2020.  Patient also has been working to increase her daily exercise.  She is found that she is more symptomatic and short of breath after exercising.  Taking her rescue inhaler prior to exercise has helped relieve some of the symptoms.  Observations/Objective:  FENO 05/26/2017-5  Chest x-ray 01/29/2009-no acute cardio pulmonary disease.  Reviewed images personally.  CBC 05/26/2017-WBC 7, eos 1.3%, absolute eosinophil count 100  PFTs 06/30/2017 FVC 2.63 [97%], FEV1 2.37 [104%], F/F 90, TLC 88%, DLCO 129% Increased diffusion capacity  Assessment and Plan:  Mild intermittent asthma with acute exacerbation Discussion: I have concerns regarding the patient's poorly controlled asthma at this point.  May need to even consider repeating pulmonary function test in the future.  Patient also needs to have an IgE lab test performed the next time she is in office and has been off of steroids.  Will treat as asthma exacerbation today as exam is limited to a telephone exam.  No audible wheezing on exam today.  Plan: Prednisone today We will hold off on empiric antibiotics at this time as patient does not have any fevers chills or productive cough or sputum Continue Advair 250, consider switching to Symbicort 160 or a different inhaler at next office visit Continue rescue inhaler as needed Close follow-up with an in office visit in 2 to 4 weeks Consider pulmonary function testing being repeated if patient continues to have exacerbations despite inhalers being switched Continue to remain physically active Flu vaccine in the fall Pneumonia vaccine at next office  visit    Follow Up Instructions:  Return in about 2 weeks (around 10/18/2018), or if symptoms worsen or fail to improve, for Follow up with Dr. Isaiah SergeMannam.   I discussed the assessment and treatment plan with the patient. The patient was provided an opportunity to ask  questions and all were answered. The patient agreed with the plan and demonstrated an understanding of the instructions.   The patient was advised to call back or seek an in-person evaluation if the symptoms worsen or if the condition fails to improve as anticipated.  I provided 26 minutes of non-face-to-face time during this encounter.   Coral CeoBrian P Mack, NP

## 2018-10-04 NOTE — Patient Instructions (Addendum)
You were seen today by Lauraine Rinne, NP  for:   1. Mild intermittent asthma with acute exacerbation  - predniSONE (DELTASONE) 10 MG tablet; 4 tabs for 2 days, then 3 tabs for 2 days, 2 tabs for 2 days, then 1 tab for 2 days, then stop  Dispense: 20 tablet; Refill: 0  Continue Advair 250 as prescribed >>>Rinse mouth out after use >>>This is not a rescue inhaler  Only use your albuterol as a rescue medication to be used if you can't catch your breath by resting or doing a relaxed purse lip breathing pattern.  - The less you use it, the better it will work when you need it. - Ok to use up to 2 puffs  every 4 hours if you must but call for immediate appointment if use goes up over your usual need - Don't leave home without it !!  (think of it like the spare tire for your car)   Continue Zyrtec daily  Continue singular daily  Can use chlorpheniramine (aka Chlor tabs) 4 mg tablet (1 to 2 tablets at night) as needed for management of allergies and postnasal drip at night >>> This is an over-the-counter medication >>> This medication is sedating   We recommend today:  No orders of the defined types were placed in this encounter.  No orders of the defined types were placed in this encounter.  Meds ordered this encounter  Medications  . predniSONE (DELTASONE) 10 MG tablet    Sig: 4 tabs for 2 days, then 3 tabs for 2 days, 2 tabs for 2 days, then 1 tab for 2 days, then stop    Dispense:  20 tablet    Refill:  0    Follow Up:    Return in about 2 weeks (around 10/18/2018), or if symptoms worsen or fail to improve, for Follow up with Dr. Vaughan Browner.   Please do your part to reduce the spread of COVID-19:      Reduce your risk of any infection  and COVID19 by using the similar precautions used for avoiding the common cold or flu:  Marland Kitchen Wash your hands often with soap and warm water for at least 20 seconds.  If soap and water are not readily available, use an alcohol-based hand sanitizer  with at least 60% alcohol.  . If coughing or sneezing, cover your mouth and nose by coughing or sneezing into the elbow areas of your shirt or coat, into a tissue or into your sleeve (not your hands). Langley Gauss A MASK when in public  . Avoid shaking hands with others and consider head nods or verbal greetings only. . Avoid touching your eyes, nose, or mouth with unwashed hands.  . Avoid close contact with people who are sick. . Avoid places or events with large numbers of people in one location, like concerts or sporting events. . If you have some symptoms but not all symptoms, continue to monitor at home and seek medical attention if your symptoms worsen. . If you are having a medical emergency, call 911.   [place] / e-Visit: [email or web address]         [facility]: [phone]  [facility]: [phone]                   [facility]: [phone]     It is flu season:   >>> Best ways to protect herself from  the flu: Receive the yearly flu vaccine, practice good hand hygiene washing with soap and also using hand sanitizer when available, eat a nutritious meals, get adequate rest, hydrate appropriately   Please contact the office if your symptoms worsen or you have concerns that you are not improving.   Thank you for choosing [facility] for your healthcare, and for allowing [place] to partner with you on your healthcare journey. I am thankful to be able to provide care to you today.   Wyn Quaker FNP-C

## 2018-10-04 NOTE — Telephone Encounter (Signed)
Called and spoke to patient.  Patient stated that directly after using Advair she experiences some chest tightness and shortness of breath.  Patient stated she doesn't feel like the inhaler is working as well as it used to work.  Patient also shared that she has recently taken up exercising and wonders if that is also contributing.  Patient reported the allergy medication that her PCP recommended is never in stock and would like to talk about that as well.  Scheduled patient for a televist with app. Nothing further needed at this time.

## 2018-10-04 NOTE — Assessment & Plan Note (Signed)
Discussion: I have concerns regarding the patient's poorly controlled asthma at this point.  May need to even consider repeating pulmonary function test in the future.  Patient also needs to have an IgE lab test performed the next time she is in office and has been off of steroids.  Will treat as asthma exacerbation today as exam is limited to a telephone exam.  No audible wheezing on exam today.  Plan: Prednisone today We will hold off on empiric antibiotics at this time as patient does not have any fevers chills or productive cough or sputum Continue Advair 250, consider switching to Symbicort 160 or a different inhaler at next office visit Continue rescue inhaler as needed Close follow-up with an in office visit in 2 to 4 weeks Consider pulmonary function testing being repeated if patient continues to have exacerbations despite inhalers being switched Continue to remain physically active Flu vaccine in the fall Pneumonia vaccine at next office visit

## 2018-10-26 ENCOUNTER — Ambulatory Visit: Payer: 59 | Admitting: Pulmonary Disease

## 2018-10-26 ENCOUNTER — Encounter: Payer: Self-pay | Admitting: Pulmonary Disease

## 2018-10-26 ENCOUNTER — Other Ambulatory Visit: Payer: Self-pay

## 2018-10-26 VITALS — BP 118/68 | HR 66 | Temp 97.1°F | Ht 60.0 in | Wt 144.2 lb

## 2018-10-26 DIAGNOSIS — Z23 Encounter for immunization: Secondary | ICD-10-CM | POA: Diagnosis not present

## 2018-10-26 DIAGNOSIS — J4521 Mild intermittent asthma with (acute) exacerbation: Secondary | ICD-10-CM

## 2018-10-26 MED ORDER — BUDESONIDE-FORMOTEROL FUMARATE 160-4.5 MCG/ACT IN AERO: 2.0000 | INHALATION_SPRAY | Freq: Two times a day (BID) | RESPIRATORY_TRACT | 0 refills | Status: DC

## 2018-10-26 MED ORDER — BUDESONIDE-FORMOTEROL FUMARATE 160-4.5 MCG/ACT IN AERO: 2.0000 | INHALATION_SPRAY | Freq: Two times a day (BID) | RESPIRATORY_TRACT | 6 refills | Status: DC

## 2018-10-26 MED ORDER — OMEPRAZOLE 40 MG PO CPDR: 40.0000 mg | DELAYED_RELEASE_CAPSULE | Freq: Every day | ORAL | 6 refills | Status: DC

## 2018-10-26 NOTE — Patient Instructions (Signed)
We will stop the Advair and start you on a medication called Symbicort 160.  Use 2 puffs twice daily Continue the Singulair We will increase the Prilosec to 40 mg a day We will check some labs including CBC with differential, IgE Schedule you for pulmonary function test  Follow-up in 1 to 2 months.

## 2018-10-26 NOTE — Progress Notes (Signed)
Heidi Collins    [id number]    [birth year]  Primary Care Physician:Reese, Zadie Cleverly, MD  Referring Physician: Lin Landsman, [address] [place],  [place] [address]  Chief complaint: Follow-up for cough, mild persistent asthma  HPI: 38 year old with past medical history of allergies, sinusitis.  Complains of chest tightness, dyspnea, nonproductive cough on and off for the past 7 years.  This is worsened for the past 1 month. Given albuterol by her primary care which improved symptoms Reports worsening chest tightness when exposed to cleaning products, exercise, smoke, stress.  In 2019 we attempted to take her off controller medication but symptoms flared up.  She is back now on Advair with good control of symptoms  Pets: No pets Occupation: Works as a Education officer, museum Exposures: No known exposures, no mold, dampness,hot tub Smoking history: Never smoker Travel history: Lives in [place].  No relevant travel Relevant family history: None  Interim history: Continues on Advair inhaler.  She feels that her symptoms are slightly worse since last visit Using her albuterol inhaler more than usual.  Has nocturnal awakenings 2-3 times a week. Also reports GERD symptoms.  She is on Prilosec 20 mg a day.  ACT score 10/26/2018-16  Outpatient Encounter Medications as of 10/26/2018  Medication Sig  . ADVAIR DISKUS 250-50 MCG/DOSE AEPB Inhale 1 puff into the lungs 2 (two) times a day.  . albuterol (VENTOLIN HFA) 108 (90 Base) MCG/ACT inhaler Inhale 2 puffs into the lungs every 6 (six) hours as needed for wheezing or shortness of breath.  . chlorpheniramine (CHLOR-TRIMETON) 4 MG tablet Take 8mg  3 times daily  . montelukast (SINGULAIR) 10 MG tablet Take 1 tablet (10 mg total) by mouth at bedtime.  Marland Kitchen omeprazole (PRILOSEC) 20 MG capsule Take 1 capsule (20 mg total) by mouth daily.  . SPRINTEC 28 0.25-35 MG-MCG tablet   . [DISCONTINUED] predniSONE (DELTASONE) 10 MG tablet 4 tabs  for 2 days, then 3 tabs for 2 days, 2 tabs for 2 days, then 1 tab for 2 days, then stop   No facility-administered encounter medications on file as of 10/26/2018.    Physical Exam: Blood pressure 118/68, pulse 66, temperature (!) 97.1 F (36.2 C), temperature source Temporal, height 5' (1.524 m), weight 144 lb 3.2 oz (65.4 kg), SpO2 100 %, unknown if currently breastfeeding. Gen:      No acute distress HEENT:  EOMI, sclera anicteric Neck:     No masses; no thyromegaly Lungs:    Clear to auscultation bilaterally; normal respiratory effort CV:         Regular rate and rhythm; no murmurs Abd:      + bowel sounds; soft, non-tender; no palpable masses, no distension Ext:    No edema; adequate peripheral perfusion Skin:      Warm and dry; no rash Neuro: alert and oriented x 3 Psych: normal mood and affect  Data Reviewed: FENO 05/26/2017-5  Chest x-ray 01/29/2009-no acute cardio pulmonary disease.  Reviewed images personally.  CBC 05/26/2017-WBC 7, eos 1.3%, absolute eosinophil count 100  PFTs 06/30/2017 FVC 2.63 [97%], FEV1 2.37 [104%], F/F 90, TLC 88%, DLCO 129% Increased diffusion capacity  Assessment:  Asthma She had responded to Advair in the past but has worsening control now We will reevaluate with CBC differential, IgE.  Repeat pulmonary function test Stop Advair and start Symbicort inhaler  GERD Has acid reflux symptoms not controlled with Prilosec 20 mg daily Increase Prilosec to 40  mg a day  Upper airway cough Secondary to allergic rhinitis, postnasal drip  Continue Flonase, Singulair  Plan/Recommendations: - Stop Advair, start Symbicort - Continue Singulair, Flonase - CBC, IgE, PFTs - Increase Prilosec to 40 mg a day  Chilton Greathouse MD [facility] 10/26/2018, 2:10 PM  CC: Leilani Able, MD

## 2018-12-13 ENCOUNTER — Other Ambulatory Visit (HOSPITAL_COMMUNITY)
Admission: RE | Admit: 2018-12-13 | Discharge: 2018-12-13 | Disposition: A | Discharge status: Home / Self Care | Payer: 59 | Source: Ambulatory Visit | Attending: Pulmonary Disease | Admitting: Pulmonary Disease

## 2018-12-13 DIAGNOSIS — Z01812 Encounter for preprocedural laboratory examination: Secondary | ICD-10-CM | POA: Insufficient documentation

## 2018-12-13 DIAGNOSIS — Z20828 Contact with and (suspected) exposure to other viral communicable diseases: Secondary | ICD-10-CM | POA: Insufficient documentation

## 2018-12-13 LAB — SARS CORONAVIRUS 2 (TAT 6-24 HRS): SARS Coronavirus 2: NEGATIVE

## 2018-12-15 ENCOUNTER — Ambulatory Visit (INDEPENDENT_AMBULATORY_CARE_PROVIDER_SITE_OTHER): Payer: 59 | Admitting: Pulmonary Disease

## 2018-12-15 ENCOUNTER — Other Ambulatory Visit: Payer: Self-pay

## 2018-12-15 DIAGNOSIS — J4521 Mild intermittent asthma with (acute) exacerbation: Secondary | ICD-10-CM | POA: Diagnosis not present

## 2018-12-15 LAB — PULMONARY FUNCTION TEST
DL/VA % pred: 141 %
DL/VA: 6.52 ml/min/mmHg/L
DLCO unc % pred: 120 %
DLCO unc: 23.07 ml/min/mmHg
FEF 25-75 Post: 3.34 L/sec
FEF 25-75 Pre: 2.79 L/sec
FEF2575-%Change-Post: 19 %
FEF2575-%Pred-Post: 125 %
FEF2575-%Pred-Pre: 104 %
FEV1-%Change-Post: 3 %
FEV1-%Pred-Post: 107 %
FEV1-%Pred-Pre: 103 %
FEV1-Post: 2.42 L
FEV1-Pre: 2.33 L
FEV1FVC-%Change-Post: 2 %
FEV1FVC-%Pred-Pre: 102 %
FEV6-%Change-Post: 1 %
FEV6-%Pred-Post: 104 %
FEV6-%Pred-Pre: 102 %
FEV6-Post: 2.75 L
FEV6-Pre: 2.71 L
FEV6FVC-%Pred-Post: 101 %
FEV6FVC-%Pred-Pre: 101 %
FVC-%Change-Post: 1 %
FVC-%Pred-Post: 102 %
FVC-%Pred-Pre: 100 %
FVC-Post: 2.75 L
FVC-Pre: 2.71 L
Post FEV1/FVC ratio: 88 %
Post FEV6/FVC ratio: 100 %
Pre FEV1/FVC ratio: 86 %
Pre FEV6/FVC Ratio: 100 %
RV % pred: 78 %
RV: 1.04 L
TLC % pred: 84 %
TLC: 3.78 L

## 2018-12-15 NOTE — Progress Notes (Signed)
PFT done today. 

## 2019-03-08 ENCOUNTER — Encounter: Payer: Self-pay | Admitting: Gastroenterology

## 2019-03-08 ENCOUNTER — Other Ambulatory Visit: Payer: Self-pay

## 2019-03-08 ENCOUNTER — Ambulatory Visit: Payer: 59 | Admitting: Gastroenterology

## 2019-03-08 VITALS — BP 118/66 | HR 72 | Temp 97.9°F | Ht 60.0 in | Wt 133.4 lb

## 2019-03-08 DIAGNOSIS — R760 Raised antibody titer: Secondary | ICD-10-CM

## 2019-03-08 DIAGNOSIS — R197 Diarrhea, unspecified: Secondary | ICD-10-CM | POA: Diagnosis not present

## 2019-03-08 DIAGNOSIS — K219 Gastro-esophageal reflux disease without esophagitis: Secondary | ICD-10-CM | POA: Diagnosis not present

## 2019-03-08 MED ORDER — OMEPRAZOLE 40 MG PO CPDR: 40.0000 mg | DELAYED_RELEASE_CAPSULE | Freq: Two times a day (BID) | ORAL | 11 refills | Status: DC

## 2019-03-08 NOTE — Progress Notes (Signed)
History of Present Illness: This is a 39 year old female referred by Lin Landsman, MD for the evaluation of GERD.  She relates problems with acid reflux and asthma during her third trimester of her 2 pregnancies.  After her second pregnancy her reflux symptoms persisted and she had ongoing treatment for asthma.  After intensifying antireflux measures and beginning a PPI her reflux symptoms and asthma have come under much better control.  She notes a cough and throat irritation which has also improved.  She relates that H. pylori antibody testing was positive and she was treated with a course of antibiotics.  Prior to receiving the antibiotic she had significant problems with gas, bloating and frequent diarrhea. These symptoms resolved completely and have not recurred since taking antibiotics for H. pylori. Denies weight loss, abdominal pain, constipation, change in stool caliber, melena, hematochezia, nausea, vomiting, dysphagia, chest pain.     No Known Allergies Outpatient Medications Prior to Visit  Medication Sig Dispense Refill  . albuterol (VENTOLIN HFA) 108 (90 Base) MCG/ACT inhaler Inhale 2 puffs into the lungs every 6 (six) hours as needed for wheezing or shortness of breath. 18 g 5  . budesonide-formoterol (SYMBICORT) 160-4.5 MCG/ACT inhaler Inhale 2 puffs into the lungs 2 (two) times daily. 1 Inhaler 6  . famotidine (PEPCID AC MAXIMUM STRENGTH) 20 MG tablet Take 20 mg by mouth daily.    Marland Kitchen omeprazole (PRILOSEC) 40 MG capsule Take 1 capsule (40 mg total) by mouth daily. 30 capsule 6  . SPRINTEC 28 0.25-35 MG-MCG tablet     . ADVAIR DISKUS 250-50 MCG/DOSE AEPB Inhale 1 puff into the lungs 2 (two) times a day. 60 each 5  . budesonide-formoterol (SYMBICORT) 160-4.5 MCG/ACT inhaler Inhale 2 puffs into the lungs 2 (two) times daily. 1 Inhaler 0  . chlorpheniramine (CHLOR-TRIMETON) 4 MG tablet Take 8mg  3 times daily 14 tablet 0  . montelukast (SINGULAIR) 10 MG tablet Take 1 tablet (10 mg  total) by mouth at bedtime. 30 tablet 11   No facility-administered medications prior to visit.   Past Medical History:  Diagnosis Date  . Environmental allergies   . GERD (gastroesophageal reflux disease)   . Helicobacter pylori infection   . Postpartum care following cesarean delivery (3/31) 05/02/2014   Past Surgical History:  Procedure Laterality Date  . CESAREAN SECTION    . CESAREAN SECTION N/A 05/02/2014   Procedure: Repeat CESAREAN SECTION;  Surgeon: Azucena Fallen, MD;  Location: [facility];  Service: Obstetrics;  Laterality: N/A;  EDD: 05/22/14   Social History   Socioeconomic History  . Marital status: Married    Spouse name: Not on file  . Number of children: Not on file  . Years of education: Not on file  . Highest education level: Not on file  Occupational History  . Not on file  Tobacco Use  . Smoking status: Never Smoker  . Smokeless tobacco: Never Used  Substance and Sexual Activity  . Alcohol use: No  . Drug use: No  . Sexual activity: Yes    Birth control/protection: Pill  Other Topics Concern  . Not on file  Social History Narrative  . Not on file   Social Determinants of Health   Financial Resource Strain:   . Difficulty of Paying Living Expenses: Not on file  Food Insecurity:   . Worried About Charity fundraiser in the Last Year: Not on file  . Ran Out of Food in the Last Year: Not on file  Transportation Needs:   . Freight forwarder (Medical): Not on file  . Lack of Transportation (Non-Medical): Not on file  Physical Activity:   . Days of Exercise per Week: Not on file  . Minutes of Exercise per Session: Not on file  Stress:   . Feeling of Stress : Not on file  Social Connections:   . Frequency of Communication with Friends and Family: Not on file  . Frequency of Social Gatherings with Friends and Family: Not on file  . Attends Religious Services: Not on file  . Active Member of Clubs or Organizations: Not on file  . Attends Tax inspector Meetings: Not on file  . Marital Status: Not on file   Family History  Problem Relation Age of Onset  . Colon cancer Maternal Grandmother        early 40's  . Breast cancer Maternal Grandmother        early 70's  . Hypertension Maternal Grandmother   . Thyroid disease Mother   . Healthy Father       Review of Systems: Pertinent positive and negative review of systems were noted in the above HPI section. All other review of systems were otherwise negative.   Physical Exam: General: Well developed, well nourished, no acute distress Head: Normocephalic and atraumatic Eyes:  sclerae anicteric, EOMI Ears: Normal auditory acuity Mouth: No deformity or lesions Neck: Supple, no masses or thyromegaly Lungs: Clear throughout to auscultation Heart: Regular rate and rhythm; no murmurs, rubs or bruits Abdomen: Soft, non tender and non distended. No masses, hepatosplenomegaly or hernias noted. Normal Bowel sounds Rectal: Not done Musculoskeletal: Symmetrical with no gross deformities  Skin: No lesions on visible extremities Pulses:  Normal pulses noted Extremities: No clubbing, cyanosis, edema or deformities noted Neurological: Alert oriented x 4, grossly nonfocal Cervical Nodes:  No significant cervical adenopathy Inguinal Nodes: No significant inguinal adenopathy Psychological:  Alert and cooperative. Normal mood and affect   Assessment and Recommendations:  1. GERD.  Asthma.  Symptoms have significantly improved however are not under excellent control.  Increase omeprazole to 40 mg p.o. twice daily taken before breakfast and dinner for 2 months.  Continue Pepcid 20 mg at bedtime.  If this regimen is more effective we will discuss remaining on this regimen.  Closely follow all antireflux measures.  Written instructions provided.  Discussed EGD for further evaluation to rule out esophagitis, ulcer.  She states she will consider  but is not ready to proceed at this time. REV  in 2 months.   2. H pylori.  Will discuss breath test to confirm eradication at her next visit.  3. Suspected SIBO or IBS which was previously causing gas, bloating and diarrhea and responded to a course of antibiotics.   cc: Leilani Able, MD [address] [place],  [place] [address]

## 2019-03-08 NOTE — Patient Instructions (Signed)
Increase your omeprazole 40 mg to twice daily before meals. A new prescription has been to your pharmacy.   Take your Pepcid AC at bedtime.   Normal BMI (Body Mass Index- based on height and weight) is between 19 and 25. Your BMI today is Body mass index is 26.05 kg/m. Marland Kitchen Please consider follow up  regarding your BMI with your Primary Care Provider.  Thank you for choosing me and [facility].  Venita Lick. Pleas Koch., MD., Clementeen Graham

## 2019-05-31 ENCOUNTER — Other Ambulatory Visit: Payer: Self-pay | Admitting: Pulmonary Disease

## 2019-06-25 ENCOUNTER — Other Ambulatory Visit: Payer: Self-pay | Admitting: *Deleted

## 2019-06-25 ENCOUNTER — Other Ambulatory Visit: Payer: Self-pay | Admitting: Pulmonary Disease

## 2019-07-05 ENCOUNTER — Other Ambulatory Visit: Payer: Self-pay | Admitting: Pulmonary Disease

## 2019-07-06 NOTE — Telephone Encounter (Signed)
Spoke with Heidi Collins regarding refilling her Omeprazole and Ventolin.  Pt has not had an ov since 10/26/18, advised she needs an OV.  GI is filling her Omaprazole.  She will contact her pharmacy to see if she has refills for her Albuterol.  Advised if she needs refills on Albuterol to call the office to schedule an OV.  Patient verbalized understanding.

## 2019-08-23 ENCOUNTER — Other Ambulatory Visit: Payer: Self-pay | Admitting: Primary Care

## 2019-10-11 ENCOUNTER — Encounter: Payer: Self-pay | Admitting: Primary Care

## 2019-10-11 ENCOUNTER — Other Ambulatory Visit: Payer: Self-pay

## 2019-10-11 ENCOUNTER — Ambulatory Visit: Payer: 59 | Admitting: Primary Care

## 2019-10-11 DIAGNOSIS — K219 Gastro-esophageal reflux disease without esophagitis: Secondary | ICD-10-CM

## 2019-10-11 DIAGNOSIS — J4521 Mild intermittent asthma with (acute) exacerbation: Secondary | ICD-10-CM

## 2019-10-11 MED ORDER — MONTELUKAST SODIUM 10 MG PO TABS: ORAL_TABLET | ORAL | 11 refills | Status: AC

## 2019-10-11 MED ORDER — BUDESONIDE-FORMOTEROL FUMARATE 160-4.5 MCG/ACT IN AERO: INHALATION_SPRAY | RESPIRATORY_TRACT | 11 refills | Status: AC

## 2019-10-11 MED ORDER — OMEPRAZOLE 40 MG PO CPDR: 40.0000 mg | DELAYED_RELEASE_CAPSULE | Freq: Every day | ORAL | 11 refills | Status: AC

## 2019-10-11 MED ORDER — ALBUTEROL SULFATE HFA 108 (90 BASE) MCG/ACT IN AERS: 2.0000 | INHALATION_SPRAY | Freq: Four times a day (QID) | RESPIRATORY_TRACT | 3 refills | Status: DC | PRN

## 2019-10-11 NOTE — Assessment & Plan Note (Signed)
-   Reports increased nocturnal dyspnea and chest tightness. Patient ran out of her ICS/LABA inhaler 1 month ago - Refill/Resume Symbicort 160 two puffs twice daily, Singulair 10mg  at bedtime - FU in 6 months televisit, if doing well consider deescalating therapy

## 2019-10-11 NOTE — Progress Notes (Signed)
@Patient  ID: , female    DOB: [birth year], 39 y.o.   MRN: [id number]  Chief Complaint  Patient presents with  . Follow-up    sob worse at night, using reflux diet which has helped     Referring provider: [id number], MD  HPI: 39 year old female, never smoked.  Past medical history significant for mild intermittent asthma, allergies, sinusitis.  Patient of Dr. 20, last seen on 10/26/2018.  Patient maintained on Symbicort 160, Singulair 10 mg at bedtime and Prilosec.  Previous [facility] pulmonary encounter:  10/26/18- Dr. 10/28/18 on Advair inhaler.  She feels that her symptoms are slightly worse since last visit Using her albuterol inhaler more than usual.  Has nocturnal awakenings 2-3 times a week. Also reports GERD symptoms.  She is on Prilosec 20 mg a day. ACT score 10/26/2018-16  10/11/2019 Patient presents today for 1 year follow-up asthma. She is doing well, reports that her shortness of breath is a little worse at night. She wakes up at night and has to use her rescue inhaler or in heat/humidity. She will wake up in the morning with chest tightness. She does experience heart burn symptoms. She has been taking Zyrtec in the morning and pepcid at bedtime. She ran out of her Symbicort, Singulair and Prilosec 1 month ago. She is following GERD diet which has helped. Denies fever, chills, cough, wheezing.   Exposure hx:  Pets: No pets Occupation: Works as a 12/11/2019 Exposures: No known exposures, no mold, dampness,hot tub Smoking history: Never smoker Travel history: Lives in [place].  No relevant travel Relevant family history: None  No Known Allergies  Immunization History  Administered Date(s) Administered  . Influenza,inj,Quad PF,6+ Mos 10/13/2017, 10/26/2018  . Influenza-Unspecified 12/05/2013  . PFIZER SARS-COV-2 Vaccination 02/16/2019, 03/09/2019  . Tdap 02/28/2014    Past Medical History:  Diagnosis Date  . Environmental allergies    . GERD (gastroesophageal reflux disease)   . Helicobacter pylori infection   . Postpartum care following cesarean delivery (3/31) 05/02/2014    Tobacco History: Social History   Tobacco Use  Smoking Status Never Smoker  Smokeless Tobacco Never Used   Counseling given: Not Answered   Outpatient Medications Prior to Visit  Medication Sig Dispense Refill  . famotidine (PEPCID AC MAXIMUM STRENGTH) 20 MG tablet Take 20 mg by mouth daily.    . SPRINTEC 28 0.25-35 MG-MCG tablet     . albuterol (VENTOLIN HFA) 108 (90 Base) MCG/ACT inhaler Inhale 2 puffs into the lungs every 6 (six) hours as needed for wheezing or shortness of breath. (Patient not taking: Reported on 10/11/2019) 18 g 5  . montelukast (SINGULAIR) 10 MG tablet TAKE 1 TABLET BY MOUTH EVERYDAY AT BEDTIME (Patient not taking: Reported on 10/11/2019) 30 tablet 11  . omeprazole (PRILOSEC) 20 MG capsule Take 2 capsules (40 mg total) by mouth daily. (Patient not taking: Reported on 10/11/2019) 30 capsule 11  . omeprazole (PRILOSEC) 40 MG capsule TAKE 1 CAPSULE BY MOUTH EVERY DAY (Patient not taking: Reported on 10/11/2019) 30 capsule 0  . SYMBICORT 160-4.5 MCG/ACT inhaler INHALE 2 PUFFS INTO THE LUNGS TWICE A DAY (Patient not taking: Reported on 10/11/2019) 10.2 Inhaler 0   No facility-administered medications prior to visit.   Review of Systems  Review of Systems  Respiratory: Positive for chest tightness and shortness of breath. Negative for cough and wheezing.   Cardiovascular: Negative.    Physical Exam  BP 108/60 (BP Location: Left Arm)  Pulse 64   Temp (!) 97.2 F (36.2 C) (Oral)   Ht 5' (1.524 m)   Wt 134 lb 6.4 oz (61 kg)   SpO2 100%   BMI 26.25 kg/m  Physical Exam Constitutional:      Appearance: Normal appearance.  Cardiovascular:     Rate and Rhythm: Normal rate and regular rhythm.  Neurological:     Mental Status: She is alert.  Psychiatric:        Mood and Affect: Mood normal.        Behavior: Behavior normal.         Thought Content: Thought content normal.        Judgment: Judgment normal.      Lab Results:  CBC    Component Value Date/Time   WBC 7.0 05/26/2017 1538   RBC 4.89 05/26/2017 1538   HGB 13.0 05/26/2017 1538   HCT 40.1 05/26/2017 1538   PLT 204.0 05/26/2017 1538   MCV 81.9 05/26/2017 1538   MCH 26.7 05/03/2014 0600   MCHC 32.4 05/26/2017 1538   RDW 12.8 05/26/2017 1538   LYMPHSABS 2.2 05/26/2017 1538   MONOABS 0.7 05/26/2017 1538   EOSABS 0.1 05/26/2017 1538   BASOSABS 0.0 05/26/2017 1538    BMET No results found for: NA, K, CL, CO2, GLUCOSE, BUN, CREATININE, CALCIUM, GFRNONAA, GFRAA  BNP No results found for: BNP  ProBNP No results found for: PROBNP  Imaging: No results found.   Assessment & Plan:   Mild intermittent asthma with acute exacerbation - Reports increased nocturnal dyspnea and chest tightness. Patient ran out of her ICS/LABA inhaler 1 month ago - Refill/Resume Symbicort 160 two puffs twice daily, Singulair 10mg  at bedtime - FU in 6 months televisit, if doing well consider deescalating therapy   GERD (gastroesophageal reflux disease) - Moderate reflux symptoms - Resume Prilosec 40mg  qd and continue pepcid 20mg  at bedtime  - Follow-up GERD diet   , NP 10/11/2019

## 2019-10-11 NOTE — Patient Instructions (Addendum)
Nice meeting you today Heidi Collins  Asthma: Resume Symbicort 160 two puffs twice daily (rinse mouth after use) Resume Singulair 10 mg at bedtime Use albuterol rescue inhaler 2 puffs every 4-6 hours as needed for breakthrough shortness of breath or wheezing  GERD: Resume Prilosec 40 mg daily in the morning (take on empty stomach 30-60 mins before breakfast) Continue Pepcid at bedtime Follow GERD diet  Follow-up: 22-month televisit with Dr. Isaiah Serge or Waynetta Sandy NP (if doing well may consider tapering Symbicort to 80)   Food Choices for Gastroesophageal Reflux Disease, Adult When you have gastroesophageal reflux disease (GERD), the foods you eat and your eating habits are very important. Choosing the right foods can help ease your discomfort. Think about working with a nutrition specialist (dietitian) to help you make good choices. What are tips for following this plan?  Meals  Choose healthy foods that are low in fat, such as fruits, vegetables, whole grains, low-fat dairy products, and lean meat, fish, and poultry.  Eat small meals often instead of 3 large meals a day. Eat your meals slowly, and in a place where you are relaxed. Avoid bending over or lying down until 2-3 hours after eating.  Avoid eating meals 2-3 hours before bed.  Avoid drinking a lot of liquid with meals.  Cook foods using methods other than frying. Bake, grill, or broil food instead.  Avoid or limit: ? Chocolate. ? Peppermint or spearmint. ? Alcohol. ? Pepper. ? Black and decaffeinated coffee. ? Black and decaffeinated tea. ? Bubbly (carbonated) soft drinks. ? Caffeinated energy drinks and soft drinks.  Limit high-fat foods such as: ? Fatty meat or fried foods. ? Whole milk, cream, butter, or ice cream. ? Nuts and nut butters. ? Pastries, donuts, and sweets made with butter or shortening.  Avoid foods that cause symptoms. These foods may be different for everyone. Common foods that cause symptoms  include: ? Tomatoes. ? Oranges, lemons, and limes. ? Peppers. ? Spicy food. ? Onions and garlic. ? Vinegar. Lifestyle  Maintain a healthy weight. Ask your doctor what weight is healthy for you. If you need to lose weight, work with your doctor to do so safely.  Exercise for at least 30 minutes for 5 or more days each week, or as told by your doctor.  Wear loose-fitting clothes.  Do not smoke. If you need help quitting, ask your doctor.  Sleep with the head of your bed higher than your feet. Use a wedge under the mattress or blocks under the bed frame to raise the head of the bed. Summary  When you have gastroesophageal reflux disease (GERD), food and lifestyle choices are very important in easing your symptoms.  Eat small meals often instead of 3 large meals a day. Eat your meals slowly, and in a place where you are relaxed.  Limit high-fat foods such as fatty meat or fried foods.  Avoid bending over or lying down until 2-3 hours after eating.  Avoid peppermint and spearmint, caffeine, alcohol, and chocolate. This information is not intended to replace advice given to you by your health care provider. Make sure you discuss any questions you have with your health care provider. Document Revised: 05/11/2018 Document Reviewed: 02/24/2016 Elsevier Patient Education  2020 [employer]    Asthma, Adult  Asthma is a long-term (chronic) condition in which the airways get tight and narrow. The airways are the breathing passages that lead from the nose and mouth down into the lungs. A person with asthma  will have times when symptoms get worse. These are called asthma attacks. They can cause coughing, whistling sounds when you breathe (wheezing), shortness of breath, and chest pain. They can make it hard to breathe. There is no cure for asthma, but medicines and lifestyle changes can help control it. There are many things that can bring on an asthma attack or make asthma symptoms worse  (triggers). Common triggers include:  Mold.  Dust.  Cigarette smoke.  Cockroaches.  Things that can cause allergy symptoms (allergens). These include animal skin flakes (dander) and pollen from trees or grass.  Things that pollute the air. These may include household cleaners, wood smoke, smog, or chemical odors.  Cold air, weather changes, and wind.  Crying or laughing hard.  Stress.  Certain medicines or drugs.  Certain foods such as dried fruit, potato chips, and grape juice.  Infections, such as a cold or the flu.  Certain medical conditions or diseases.  Exercise or tiring activities. Asthma may be treated with medicines and by staying away from the things that cause asthma attacks. Types of medicines may include:  Controller medicines. These help prevent asthma symptoms. They are usually taken every day.  Fast-acting reliever or rescue medicines. These quickly relieve asthma symptoms. They are used as needed and provide short-term relief.  Allergy medicines if your attacks are brought on by allergens.  Medicines to help control the body's defense (immune) system. Follow these instructions at home: Avoiding triggers in your home  Change your heating and air conditioning filter often.  Limit your use of fireplaces and wood stoves.  Get rid of pests (such as roaches and mice) and their droppings.  Throw away plants if you see mold on them.  Clean your floors. Dust regularly. Use cleaning products that do not smell.  Have someone vacuum when you are not home. Use a vacuum cleaner with a HEPA filter if possible.  Replace carpet with wood, tile, or vinyl flooring. Carpet can trap animal skin flakes and dust.  Use allergy-proof pillows, mattress covers, and box spring covers.  Wash bed sheets and blankets every week in hot water. Dry them in a dryer.  Keep your bedroom free of any triggers.  Avoid pets and keep windows closed when things that cause allergy  symptoms are in the air.  Use blankets that are made of polyester or cotton.  Clean bathrooms and kitchens with bleach. If possible, have someone repaint the walls in these rooms with mold-resistant paint. Keep out of the rooms that are being cleaned and painted.  Wash your hands often with soap and water. If soap and water are not available, use hand sanitizer.  Do not allow anyone to smoke in your home. General instructions  Take over-the-counter and prescription medicines only as told by your doctor. ? Talk with your doctor if you have questions about how or when to take your medicines. ? Make note if you need to use your medicines more often than usual.  Do not use any products that contain nicotine or tobacco, such as cigarettes and e-cigarettes. If you need help quitting, ask your doctor.  Stay away from secondhand smoke.  Avoid doing things outdoors when allergen counts are high and when air quality is low.  Wear a ski mask when doing outdoor activities in the winter. The mask should cover your nose and mouth. Exercise indoors on cold days if you can.  Warm up before you exercise. Take time to cool down after exercise.  Use a peak flow meter as told by your doctor. A peak flow meter is a tool that measures how well the lungs are working.  Keep track of the peak flow meter's readings. Write them down.  Follow your asthma action plan. This is a written plan for taking care of your asthma and treating your attacks.  Make sure you get all the shots (vaccines) that your doctor recommends. Ask your doctor about a flu shot and a pneumonia shot.  Keep all follow-up visits as told by your doctor. This is important. Contact a doctor if:  You have wheezing, shortness of breath, or a cough even while taking medicine to prevent attacks.  The mucus you cough up (sputum) is thicker than usual.  The mucus you cough up changes from clear or white to yellow, green, gray, or  bloody.  You have problems from the medicine you are taking, such as: ? A rash. ? Itching. ? Swelling. ? Trouble breathing.  You need reliever medicines more than 2-3 times a week.  Your peak flow reading is still at 50-79% of your personal best after following the action plan for 1 hour.  You have a fever. Get help right away if:  You seem to be worse and are not responding to medicine during an asthma attack.  You are short of breath even at rest.  You get short of breath when doing very little activity.  You have trouble eating, drinking, or talking.  You have chest pain or tightness.  You have a fast heartbeat.  Your lips or fingernails start to turn blue.  You are light-headed or dizzy, or you faint.  Your peak flow is less than 50% of your personal best.  You feel too tired to breathe normally. Summary  Asthma is a long-term (chronic) condition in which the airways get tight and narrow. An asthma attack can make it hard to breathe.  Asthma cannot be cured, but medicines and lifestyle changes can help control it.  Make sure you understand how to avoid triggers and how and when to use your medicines. This information is not intended to replace advice given to you by your health care provider. Make sure you discuss any questions you have with your health care provider. Document Revised: 03/23/2018 Document Reviewed: 02/23/2016 Elsevier Patient Education  2020 [employer].

## 2019-10-11 NOTE — Assessment & Plan Note (Signed)
-   Moderate reflux symptoms - Resume Prilosec 40mg  qd and continue pepcid 20mg  at bedtime  - Follow-up GERD diet

## 2020-02-29 ENCOUNTER — Other Ambulatory Visit: Payer: Self-pay | Admitting: Primary Care

## 2020-04-10 ENCOUNTER — Ambulatory Visit: Payer: 59 | Admitting: Primary Care

## 2020-04-11 ENCOUNTER — Other Ambulatory Visit: Payer: Self-pay

## 2020-04-11 ENCOUNTER — Encounter: Payer: 59 | Admitting: Primary Care

## 2020-04-11 NOTE — Progress Notes (Signed)
 Err

## 2021-08-14 ENCOUNTER — Encounter: Payer: Self-pay | Admitting: Physician Assistant

## 2021-08-14 ENCOUNTER — Ambulatory Visit: Payer: 59 | Admitting: Physician Assistant

## 2021-08-14 VITALS — BP 112/72 | HR 67 | Ht 60.0 in | Wt 134.0 lb

## 2021-08-14 DIAGNOSIS — E739 Lactose intolerance, unspecified: Secondary | ICD-10-CM

## 2021-08-14 NOTE — Progress Notes (Signed)
Chief Complaint: Lactose allergy?  HPI:    Heidi Collins is a 41 year old African-American female with past medical history as listed below including reflux and environmental allergies, known to Dr. Russella Dar, who was referred to me by Leilani Able, MD for a complaint of lactose allergy.      03/08/2019 office visit with Dr. Russella Dar for GERD.  At that time discussed asthma and symptoms had significantly improved.  Her Omeprazole was increased to 40 mg twice a day and continued on Pepcid 20 mg at night.  Discussed an EGD which she was not ready for yet.    Today, the patient tells me that she has always had some symptoms when she ate dairy including stomach pain and diarrhea.  Over the past couple of years she has had to narrow down to certain dairy that she could eat.  Apparently she could only handle [place] ice cream and no other brands and could only eat certain types of meals with dairy.  In April though it seemed that everything with the dairy started to give her problems about 30 to 40 minutes after eating developed terrible gas and stomach pain and eventually diarrhea.  She has tried Lactaid which helps delay the diarrhea a little bit but she still gets abdominal pain.  Tells me she also seems to have some reaction to peanut oil and other products.  As long as she avoids these things she has no troubles.    Denies fever, chills, weight loss, blood in her stool or symptoms that awaken her from sleep.  Past Medical History:  Diagnosis Date   Environmental allergies    GERD (gastroesophageal reflux disease)    Helicobacter pylori infection    Postpartum care following cesarean delivery (3/31) 05/02/2014    Past Surgical History:  Procedure Laterality Date   CESAREAN SECTION     CESAREAN SECTION N/A 05/02/2014   Procedure: Repeat CESAREAN SECTION;  Surgeon: Shea Evans, MD;  Location: [facility];  Service: Obstetrics;  Laterality: N/A;  EDD: 05/22/14    Current Outpatient Medications  Medication  Sig Dispense Refill   albuterol (VENTOLIN HFA) 108 (90 Base) MCG/ACT inhaler TAKE 2 PUFFS BY MOUTH EVERY 6 HOURS AS NEEDED FOR WHEEZE OR SHORTNESS OF BREATH 6.7 each 3   SPRINTEC 28 0.25-35 MG-MCG tablet      budesonide-formoterol (SYMBICORT) 160-4.5 MCG/ACT inhaler INHALE 2 PUFFS INTO THE LUNGS TWICE A DAY (Patient not taking: Reported on 08/14/2021) 10.2 g 11   famotidine (PEPCID AC MAXIMUM STRENGTH) 20 MG tablet Take 20 mg by mouth daily. (Patient not taking: Reported on 08/14/2021)     montelukast (SINGULAIR) 10 MG tablet TAKE 1 TABLET BY MOUTH EVERYDAY AT BEDTIME (Patient not taking: Reported on 08/14/2021) 30 tablet 11   omeprazole (PRILOSEC) 40 MG capsule Take 1 capsule (40 mg total) by mouth daily. (Patient not taking: Reported on 08/14/2021) 30 capsule 11   No current facility-administered medications for this visit.    Allergies as of 08/14/2021   (No Known Allergies)    Family History  Problem Relation Age of Onset   Colon cancer Maternal Grandmother        early 94's   Breast cancer Maternal Grandmother        early 60's   Hypertension Maternal Grandmother    Thyroid disease Mother    Healthy Father     Social History   Socioeconomic History   Marital status: Married    Spouse name: Not on file  Number of children: Not on file   Years of education: Not on file   Highest education level: Not on file  Occupational History   Not on file  Tobacco Use   Smoking status: Never   Smokeless tobacco: Never  Vaping Use   Vaping Use: Never used  Substance and Sexual Activity   Alcohol use: No   Drug use: No   Sexual activity: Yes    Birth control/protection: Pill  Other Topics Concern   Not on file  Social History Narrative   Not on file   Social Determinants of Health   Financial Resource Strain: Not on file  Food Insecurity: Not on file  Transportation Needs: Not on file  Physical Activity: Not on file  Stress: Not on file  Social Connections: Not on file   Intimate Partner Violence: Not on file    Review of Systems:    Constitutional: No weight loss, fever or chills Skin: No rash  Cardiovascular: No chest pain Respiratory: No SOB Gastrointestinal: See HPI and otherwise negative Genitourinary: No dysuria  Neurological: No headache, dizziness or syncope Musculoskeletal: No new muscle or joint pain Hematologic: No bleeding  Psychiatric: No history of depression or anxiety   Physical Exam:  Vital signs: BP 112/72   Pulse 67   Ht 5' (1.524 m)   Wt 134 lb (60.8 kg)   BMI 26.17 kg/m    Constitutional:   Pleasant AA female appears to be in NAD, Well developed, Well nourished, alert and cooperative Head:  Normocephalic and atraumatic. Eyes:   PEERL, EOMI. No icterus. Conjunctiva pink. Ears:  Normal auditory acuity. Neck:  Supple Throat: Oral cavity and pharynx without inflammation, swelling or lesion.  Respiratory: Respirations even and unlabored. Lungs clear to auscultation bilaterally.   No wheezes, crackles, or rhonchi.  Cardiovascular: Normal S1, S2. No MRG. Regular rate and rhythm. No peripheral edema, cyanosis or pallor.  Gastrointestinal:  Soft, nondistended, nontender. No rebound or guarding. Normal bowel sounds. No appreciable masses or hepatomegaly. Rectal:  Not performed.  Msk:  Symmetrical without gross deformities. Without edema, no deformity or joint abnormality.  Neurologic:  Alert and  oriented x4;  grossly normal neurologically.  Skin:   Dry and intact without significant lesions or rashes. Psychiatric:  Demonstrates good judgement and reason without abnormal affect or behaviors.  RELEVANT LABS AND IMAGING: CBC    Component Value Date/Time   WBC 7.0 05/26/2017 1538   RBC 4.89 05/26/2017 1538   HGB 13.0 05/26/2017 1538   HCT 40.1 05/26/2017 1538   PLT 204.0 05/26/2017 1538   MCV 81.9 05/26/2017 1538   MCH 26.7 05/03/2014 0600   MCHC 32.4 05/26/2017 1538   RDW 12.8 05/26/2017 1538   LYMPHSABS 2.2 05/26/2017  1538   MONOABS 0.7 05/26/2017 1538   EOSABS 0.1 05/26/2017 1538   BASOSABS 0.0 05/26/2017 1538    Assessment: 1. Lactose allergy: Describes that within 30 to 40 minutes she has severe gas and stomach pain which will result in diarrhea after eating anything with dairy, this has grown more severe since April of this year  Plan: 1.  Discussed hydrogen breath testing with the patient and how that would let us know for sure that she has a lactose allergy.  Patient would prefer not to do that as she is going to avoid lactose anyways since she gets such extreme symptoms.  She does express she has some reaction to peanut oil and other things and I recommend that she be sent  to the allergist to further explore these. 2.  Referral to [place] [facility] 3.  Patient can continue her trial of Lactaid tabs prior to eating and otherwise avoid lactose. 4.  Patient to follow in clinic with [place] as needed.  Hyacinth Meeker, PA-C [facility] 08/14/2021, 8:31 AM  Cc: Leilani Able, MD

## 2021-08-14 NOTE — Patient Instructions (Signed)
You have been referred to [facility]. Your appointment is 09/11/21 at 2:45 pm. Any allergy medications will need to be held 3 days prior to your appointment. If you have any questions, contact them at [phone]. Their office is located at [address], [place], [place] [address].   If you are age 40 or older, your body mass index should be between 23-30. Your Body mass index is 26.17 kg/m. If this is out of the aforementioned range listed, please consider follow up with your Primary Care Provider.  If you are age 89 or younger, your body mass index should be between 19-25. Your Body mass index is 26.17 kg/m. If this is out of the aformentioned range listed, please consider follow up with your Primary Care Provider.   ________________________________________________________  The [facility] providers would like to encourage you to use [facility] to communicate with providers for non-urgent requests or questions.  Due to long hold times on the telephone, sending your provider a message by [facility] may be a faster and more efficient way to get a response.  Please allow 48 business hours for a response.  Please remember that this is for non-urgent requests.  _______________________________________________________   Follow up as needed.  Thank you for entrusting me with your care and choosing [facility].  Hyacinth Meeker

## 2023-09-06 NOTE — Progress Notes (Signed)
 This encounter was created in error - please disregard.
